# Patient Record
Sex: Female | Born: 1985 | Race: White | Marital: Single
Health system: Northeastern US, Community
[De-identification: ages and names within clinical notes are randomized; demographics above are authoritative.]

## PROBLEM LIST (undated history)

## (undated) DIAGNOSIS — F419 Anxiety disorder, unspecified: Secondary | ICD-10-CM

## (undated) DIAGNOSIS — F329 Major depressive disorder, single episode, unspecified: Secondary | ICD-10-CM

## (undated) DIAGNOSIS — K219 Gastro-esophageal reflux disease without esophagitis: Secondary | ICD-10-CM

## (undated) DIAGNOSIS — J45909 Unspecified asthma, uncomplicated: Secondary | ICD-10-CM

## (undated) DIAGNOSIS — F32A Depression, unspecified: Secondary | ICD-10-CM

## (undated) HISTORY — PX: WISDOM TOOTH EXTRACTION: SHX21

## (undated) HISTORY — PX: UPPER GI ENDOSCOPY: SHX6162

---
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
## 2007-06-18 DIAGNOSIS — J45909 Unspecified asthma, uncomplicated: Secondary | ICD-10-CM | POA: Insufficient documentation

## 2007-06-18 DIAGNOSIS — F3289 Other specified depressive episodes: Secondary | ICD-10-CM | POA: Insufficient documentation

## 2007-06-18 DIAGNOSIS — R569 Unspecified convulsions: Secondary | ICD-10-CM | POA: Insufficient documentation

## 2007-06-18 DIAGNOSIS — F329 Major depressive disorder, single episode, unspecified: Secondary | ICD-10-CM | POA: Insufficient documentation

## 2009-01-07 ENCOUNTER — Emergency Department (HOSPITAL_COMMUNITY): Admission: EM | Admit: 2009-01-07 | Discharge: 2009-01-07 | Payer: Self-pay | Admitting: Emergency Medicine

## 2009-12-18 ENCOUNTER — Emergency Department (HOSPITAL_COMMUNITY): Admission: EM | Admit: 2009-12-18 | Discharge: 2009-12-18 | Payer: Self-pay | Admitting: Family Medicine

## 2010-06-15 ENCOUNTER — Other Ambulatory Visit: Admission: RE | Admit: 2010-06-15 | Discharge: 2010-06-15 | Payer: Self-pay | Admitting: Family Medicine

## 2010-06-22 ENCOUNTER — Encounter: Admission: RE | Admit: 2010-06-22 | Discharge: 2010-06-22 | Payer: Self-pay | Admitting: Family Medicine

## 2011-02-15 LAB — POCT I-STAT, CHEM 8
Chloride: 106 mEq/L (ref 96–112)
Creatinine, Ser: 0.7 mg/dL (ref 0.4–1.2)
Glucose, Bld: 99 mg/dL (ref 70–99)
HCT: 44 % (ref 36.0–46.0)
Sodium: 140 mEq/L (ref 135–145)
TCO2: 23 mmol/L (ref 0–100)

## 2011-02-15 LAB — POCT URINALYSIS DIP (DEVICE)
Bilirubin Urine: NEGATIVE
Glucose, UA: NEGATIVE mg/dL
Hgb urine dipstick: NEGATIVE
Ketones, ur: 40 mg/dL — AB
Nitrite: NEGATIVE
Protein, ur: 30 mg/dL — AB
Specific Gravity, Urine: 1.02 (ref 1.005–1.030)
pH: 5.5 (ref 5.0–8.0)

## 2011-03-04 ENCOUNTER — Emergency Department (HOSPITAL_COMMUNITY): Payer: BC Managed Care – PPO

## 2011-03-04 ENCOUNTER — Emergency Department (HOSPITAL_COMMUNITY)
Admission: EM | Admit: 2011-03-04 | Discharge: 2011-03-04 | Disposition: A | Discharge status: Home / Self Care | Payer: BC Managed Care – PPO | Attending: Emergency Medicine | Admitting: Emergency Medicine

## 2011-03-04 DIAGNOSIS — R0609 Other forms of dyspnea: Secondary | ICD-10-CM | POA: Insufficient documentation

## 2011-03-04 DIAGNOSIS — R05 Cough: Secondary | ICD-10-CM | POA: Insufficient documentation

## 2011-03-04 DIAGNOSIS — J45909 Unspecified asthma, uncomplicated: Secondary | ICD-10-CM | POA: Insufficient documentation

## 2011-03-04 DIAGNOSIS — R059 Cough, unspecified: Secondary | ICD-10-CM | POA: Insufficient documentation

## 2011-03-04 DIAGNOSIS — R0989 Other specified symptoms and signs involving the circulatory and respiratory systems: Secondary | ICD-10-CM | POA: Insufficient documentation

## 2013-01-12 ENCOUNTER — Emergency Department (HOSPITAL_COMMUNITY)
Admission: EM | Admit: 2013-01-12 | Discharge: 2013-01-12 | Disposition: A | Discharge status: Home / Self Care | Payer: BC Managed Care – PPO | Attending: Emergency Medicine | Admitting: Emergency Medicine

## 2013-01-12 ENCOUNTER — Encounter (HOSPITAL_COMMUNITY): Payer: Self-pay | Admitting: Emergency Medicine

## 2013-01-12 DIAGNOSIS — R631 Polydipsia: Secondary | ICD-10-CM | POA: Insufficient documentation

## 2013-01-12 DIAGNOSIS — K529 Noninfective gastroenteritis and colitis, unspecified: Secondary | ICD-10-CM

## 2013-01-12 DIAGNOSIS — R000 Tachycardia, unspecified: Secondary | ICD-10-CM | POA: Insufficient documentation

## 2013-01-12 DIAGNOSIS — E669 Obesity, unspecified: Secondary | ICD-10-CM | POA: Insufficient documentation

## 2013-01-12 DIAGNOSIS — R1084 Generalized abdominal pain: Secondary | ICD-10-CM | POA: Insufficient documentation

## 2013-01-12 DIAGNOSIS — R197 Diarrhea, unspecified: Secondary | ICD-10-CM | POA: Insufficient documentation

## 2013-01-12 DIAGNOSIS — Z3202 Encounter for pregnancy test, result negative: Secondary | ICD-10-CM | POA: Insufficient documentation

## 2013-01-12 DIAGNOSIS — K5289 Other specified noninfective gastroenteritis and colitis: Secondary | ICD-10-CM | POA: Insufficient documentation

## 2013-01-12 DIAGNOSIS — J45909 Unspecified asthma, uncomplicated: Secondary | ICD-10-CM | POA: Insufficient documentation

## 2013-01-12 DIAGNOSIS — F329 Major depressive disorder, single episode, unspecified: Secondary | ICD-10-CM | POA: Insufficient documentation

## 2013-01-12 DIAGNOSIS — F3289 Other specified depressive episodes: Secondary | ICD-10-CM | POA: Insufficient documentation

## 2013-01-12 DIAGNOSIS — R5381 Other malaise: Secondary | ICD-10-CM | POA: Insufficient documentation

## 2013-01-12 DIAGNOSIS — R5383 Other fatigue: Secondary | ICD-10-CM | POA: Insufficient documentation

## 2013-01-12 HISTORY — DX: Unspecified asthma, uncomplicated: J45.909

## 2013-01-12 HISTORY — DX: Major depressive disorder, single episode, unspecified: F32.9

## 2013-01-12 HISTORY — DX: Depression, unspecified: F32.A

## 2013-01-12 LAB — URINALYSIS, ROUTINE W REFLEX MICROSCOPIC
Glucose, UA: NEGATIVE mg/dL
Nitrite: NEGATIVE
Protein, ur: NEGATIVE mg/dL
Specific Gravity, Urine: 1.023 (ref 1.005–1.030)

## 2013-01-12 LAB — POCT I-STAT, CHEM 8
BUN: 14 mg/dL (ref 6–23)
Chloride: 105 mEq/L (ref 96–112)
Glucose, Bld: 116 mg/dL — ABNORMAL HIGH (ref 70–99)
HCT: 37 % (ref 36.0–46.0)
Hemoglobin: 12.6 g/dL (ref 12.0–15.0)
Sodium: 141 mEq/L (ref 135–145)

## 2013-01-12 LAB — PREGNANCY, URINE: Preg Test, Ur: NEGATIVE

## 2013-01-12 MED ORDER — SODIUM CHLORIDE 0.9 % IV BOLUS (SEPSIS)
2000.0000 mL | Freq: Once | INTRAVENOUS | Status: AC
  Administered 2013-01-12: 2000 mL via INTRAVENOUS

## 2013-01-12 MED ORDER — ONDANSETRON 4 MG PO TBDP: ORAL_TABLET | ORAL | Status: DC

## 2013-01-12 MED ORDER — ACETAMINOPHEN 325 MG PO TABS
650.0000 mg | ORAL_TABLET | Freq: Once | ORAL | Status: AC
  Administered 2013-01-12: 650 mg via ORAL
  Filled 2013-01-12: qty 2

## 2013-01-12 MED ORDER — ONDANSETRON HCL 4 MG/2ML IJ SOLN
4.0000 mg | Freq: Once | INTRAMUSCULAR | Status: AC
  Administered 2013-01-12: 4 mg via INTRAVENOUS
  Filled 2013-01-12: qty 2

## 2013-01-12 MED ORDER — LOPERAMIDE HCL 2 MG PO CAPS
4.0000 mg | ORAL_CAPSULE | Freq: Once | ORAL | Status: AC
  Administered 2013-01-12: 4 mg via ORAL
  Filled 2013-01-12: qty 2

## 2013-01-12 NOTE — ED Notes (Signed)
Per Pt- Pt started having nausea and vomiting around 2200 tonight. Pt states she has felt fatigue for the last few days. Alertx4, NAD

## 2013-01-12 NOTE — ED Notes (Signed)
Notified lab to regular preg test down stairs

## 2013-01-12 NOTE — ED Provider Notes (Signed)
History     CSN: [id number]  Arrival date & time 01/12/13  5638   First MD Initiated Contact with Patient 01/12/13 0505      Chief Complaint  Patient presents with  . Emesis    (Consider location/radiation/quality/duration/timing/severity/associated sxs/prior treatment) HPI Patient developed nausea vomiting and watery diarrhea onset 8 PM yesterday. Vomiting approximately 8 times at first food and and yellow material. Has had approximately 10 episodes of watery diarrhea. She also admits to vague diffuse abdominal discomfort. No treatment prior to coming here. Nothing makes symptoms better or worse. Abdominal discomfort is nonradiating. Mild at present. Other associated symptoms include generalized weakness, and thirst. Past Medical History  Diagnosis Date  . Asthma   . Depression     History reviewed. No pertinent past surgical history. Surgical history negative No family history on file.  History  Substance Use Topics  . Smoking status: Not on file  . Smokeless tobacco: Not on file  . Alcohol Use: Not on file   No tobacco. Occasional alcohol no drug use OB History   Grav Para Term Preterm Abortions TAB SAB Ect Mult Living                  Review of Systems  HENT: Negative.   Respiratory: Negative.   Cardiovascular: Negative.   Gastrointestinal: Positive for nausea, vomiting, abdominal pain and diarrhea.  Musculoskeletal: Negative.   Skin: Negative.   Neurological: Positive for weakness.  Psychiatric/Behavioral: Negative.   All other systems reviewed and are negative.    Allergies  Penicillins  Home Medications  No current outpatient prescriptions on file. Medications Lexapro, birth control pills, vitamin D, albuterol BP 124/75  Temp(Src) 99.9 F (37.7 C) (Oral)  Resp 15  SpO2 98%  Physical Exam  Nursing note and vitals reviewed. Constitutional: She appears well-developed and well-nourished. No distress.  HENT:  Head: Normocephalic and atraumatic.   Mucous membranes dry  Eyes: Conjunctivae are normal. Pupils are equal, round, and reactive to light.  Neck: Neck supple. No tracheal deviation present. No thyromegaly present.  Cardiovascular: Regular rhythm.   No murmur heard. Tachycardic  Pulmonary/Chest: Effort normal and breath sounds normal.  Abdominal: Soft. Bowel sounds are normal. She exhibits no distension. There is no tenderness.  Obese  Musculoskeletal: Normal range of motion. She exhibits no edema and no tenderness.  Neurological: She is alert. No cranial nerve deficit. Coordination normal.  Skin: Skin is warm and dry. No rash noted.  Psychiatric: She has a normal mood and affect.    ED Course  Procedures (including critical care time)  Labs Reviewed  URINALYSIS, ROUTINE W REFLEX MICROSCOPIC   No results found.  Date: 01/12/2013  Rate: 120  Rhythm: sinus tachycardia  QRS Axis: normal  Intervals: normal  ST/T Wave abnormalities: normal  Conduction Disutrbances:none  Narrative Interpretation:   Old EKG Reviewed: none available Results for orders placed during the hospital encounter of 01/12/13  POCT I-STAT, CHEM 8      Result Value Range   Sodium 141  135 - 145 mEq/L   Potassium 3.7  3.5 - 5.1 mEq/L   Chloride 105  96 - 112 mEq/L   BUN 14  6 - 23 mg/dL   Creatinine, Ser 7.56  0.50 - 1.10 mg/dL   Glucose, Bld 433 (*) 70 - 99 mg/dL   Calcium, Ion 2.95 (*) 1.12 - 1.23 mmol/L   TCO2 28  0 - 100 mmol/L   Hemoglobin 12.6  12.0 - 15.0 g/dL  HCT 37.0  36.0 - 46.0 %   No results found.   No diagnosis found.  7: 15 a.m. patient feels improved after treatment with intravenous fluids, Zofran and Imodium. She's resting comfortably. Signed out to Dr Ranae Palms pending lab results  MDM  Patient mildly dehydrated on initial presentation.Dr. Ranae Palms to reassess and disposition Diagnosis #1 gastroenteritis #2 dehydration        Doug Sou, MD 01/12/13 [phone]

## 2013-04-07 ENCOUNTER — Emergency Department (HOSPITAL_COMMUNITY)
Admission: EM | Admit: 2013-04-07 | Discharge: 2013-04-07 | Disposition: A | Discharge status: Home / Self Care | Payer: BC Managed Care – PPO | Attending: Emergency Medicine | Admitting: Emergency Medicine

## 2013-04-07 ENCOUNTER — Emergency Department (HOSPITAL_COMMUNITY): Payer: BC Managed Care – PPO

## 2013-04-07 ENCOUNTER — Encounter (HOSPITAL_COMMUNITY): Payer: Self-pay | Admitting: *Deleted

## 2013-04-07 DIAGNOSIS — F329 Major depressive disorder, single episode, unspecified: Secondary | ICD-10-CM | POA: Insufficient documentation

## 2013-04-07 DIAGNOSIS — J45909 Unspecified asthma, uncomplicated: Secondary | ICD-10-CM | POA: Insufficient documentation

## 2013-04-07 DIAGNOSIS — R112 Nausea with vomiting, unspecified: Secondary | ICD-10-CM | POA: Insufficient documentation

## 2013-04-07 DIAGNOSIS — Z79899 Other long term (current) drug therapy: Secondary | ICD-10-CM | POA: Insufficient documentation

## 2013-04-07 DIAGNOSIS — S8990XA Unspecified injury of unspecified lower leg, initial encounter: Secondary | ICD-10-CM | POA: Insufficient documentation

## 2013-04-07 DIAGNOSIS — F3289 Other specified depressive episodes: Secondary | ICD-10-CM | POA: Insufficient documentation

## 2013-04-07 DIAGNOSIS — S99911A Unspecified injury of right ankle, initial encounter: Secondary | ICD-10-CM

## 2013-04-07 DIAGNOSIS — S99912A Unspecified injury of left ankle, initial encounter: Secondary | ICD-10-CM

## 2013-04-07 DIAGNOSIS — F10120 Alcohol abuse with intoxication, uncomplicated: Secondary | ICD-10-CM

## 2013-04-07 DIAGNOSIS — S0990XA Unspecified injury of head, initial encounter: Secondary | ICD-10-CM | POA: Insufficient documentation

## 2013-04-07 DIAGNOSIS — Z880 Allergy status to penicillin: Secondary | ICD-10-CM | POA: Insufficient documentation

## 2013-04-07 DIAGNOSIS — Y929 Unspecified place or not applicable: Secondary | ICD-10-CM | POA: Insufficient documentation

## 2013-04-07 DIAGNOSIS — X500XXA Overexertion from strenuous movement or load, initial encounter: Secondary | ICD-10-CM | POA: Insufficient documentation

## 2013-04-07 DIAGNOSIS — S99929A Unspecified injury of unspecified foot, initial encounter: Secondary | ICD-10-CM | POA: Insufficient documentation

## 2013-04-07 DIAGNOSIS — Y9389 Activity, other specified: Secondary | ICD-10-CM | POA: Insufficient documentation

## 2013-04-07 LAB — POCT I-STAT, CHEM 8
Calcium, Ion: 1.2 mmol/L (ref 1.12–1.23)
Glucose, Bld: 78 mg/dL (ref 70–99)
Hemoglobin: 14.6 g/dL (ref 12.0–15.0)
Potassium: 3.9 mEq/L (ref 3.5–5.1)
TCO2: 28 mmol/L (ref 0–100)

## 2013-04-07 MED ORDER — ONDANSETRON HCL 8 MG PO TABS: 4.0000 mg | ORAL_TABLET | Freq: Once | ORAL | Status: AC

## 2013-04-07 MED ORDER — ONDANSETRON 4 MG PO TBDP
ORAL_TABLET | ORAL | Status: AC
  Administered 2013-04-07: 4 mg via ORAL
  Filled 2013-04-07: qty 1

## 2013-04-07 MED ORDER — LORAZEPAM 2 MG/ML IJ SOLN
1.0000 mg | Freq: Once | INTRAMUSCULAR | Status: AC
  Administered 2013-04-07: 1 mg via INTRAMUSCULAR
  Filled 2013-04-07: qty 1

## 2013-04-07 MED ORDER — ONDANSETRON 4 MG PO TBDP: ORAL_TABLET | ORAL | Status: DC

## 2013-04-07 MED ORDER — ONDANSETRON 4 MG PO TBDP
ORAL_TABLET | ORAL | Status: AC
  Filled 2013-04-07: qty 1

## 2013-04-07 MED ORDER — ONDANSETRON 4 MG PO TBDP
4.0000 mg | ORAL_TABLET | Freq: Once | ORAL | Status: AC
  Administered 2013-04-07 (×2): 4 mg via ORAL

## 2013-04-07 MED ORDER — IBUPROFEN 400 MG PO TABS
600.0000 mg | ORAL_TABLET | Freq: Once | ORAL | Status: AC
  Administered 2013-04-07: 600 mg via ORAL
  Filled 2013-04-07: qty 1

## 2013-04-07 NOTE — ED Notes (Signed)
Pt states drinking etoh last nite.  Pt states that she fell around 3 am and now has pain to both ankles.  Then started vomiting around 0900 and developed a headache related to etoh.  Pt never hit head when she fell

## 2013-04-07 NOTE — ED Notes (Signed)
Pt continues to tolerate fluids.

## 2013-04-07 NOTE — ED Provider Notes (Signed)
History  This chart was scribed for non-physician practitioner Arthor Captain, PA-C, working with Flint Melter, MD, by Yevette Edwards, ED Scribe. This patient was seen in room TR09C/TR09C and the patient's care was started at 5:40 PM   CSN: [id number]  Arrival date & time 04/07/13  1326   First MD Initiated Contact with Patient 04/07/13 1652      Chief Complaint  Patient presents with  . Emesis  . Ankle Injury  . Headache    The history is provided by the patient. No language interpreter was used.   HPI Comments: Leslie Thompson is a 27 y.o. female who presents to the Emergency Department complaining of constant, diffuse bilateral pain to the ankles which occurred after rolling her right ankle and then falling last night. She denies hitting her head or LOC following the fall. She denies numbness.  The pt states that she has a headache and has experienced nausea and emesis due to drinking tequila last night.    Past Medical History  Diagnosis Date  . Asthma   . Depression     History reviewed. No pertinent past surgical history.  No family history on file.  History  Substance Use Topics  . Smoking status: Never Smoker   . Smokeless tobacco: Not on file  . Alcohol Use: Yes    No ob history provided.   Review of Systems  Eyes: Negative for visual disturbance.  Gastrointestinal: Positive for nausea and vomiting.  Musculoskeletal: Positive for arthralgias.  Neurological: Positive for headaches. Negative for syncope and numbness.  All other systems reviewed and are negative.   Allergies  Penicillins  Home Medications   Current Outpatient Rx  Name  Route  Sig  Dispense  Refill  . albuterol (PROVENTIL HFA;VENTOLIN HFA) 108 (90 BASE) MCG/ACT inhaler   Inhalation   Inhale 2 puffs into the lungs every 6 (six) hours as needed for wheezing.         . budesonide (PULMICORT) 180 MCG/ACT inhaler   Inhalation   Inhale 1 puff into the lungs 2 (two) times daily.          Marland Kitchen escitalopram (LEXAPRO) 10 MG tablet   Oral   Take 20 mg by mouth daily.         Marland Kitchen PRESCRIPTION MEDICATION   Oral   Take 1 tablet by mouth daily. Birth control tablet         . Vitamin D, Ergocalciferol, (DRISDOL) 50000 UNITS CAPS   Oral   Take 50,000 Units by mouth every 7 (seven) days. Take on thursday           Triage Vitals: BP 130/73  Pulse 77  Temp(Src) 98.5 F (36.9 C) (Oral)  Resp 18  SpO2 97%  LMP 03/02/2013  Physical Exam  Nursing note and vitals reviewed. Constitutional: She is oriented to person, place, and time. She appears well-developed and well-nourished. No distress.  HENT:  Head: Normocephalic and atraumatic.  Multiple petechiae surrounding eyes, forehead  Pulmonary/Chest: Effort normal. No respiratory distress.  Musculoskeletal: She exhibits tenderness.  A bilateral ankle exam was performed. Skin: bruising Swelling: mild Warmth: no warmth Tenderness: diffuse and maximal at left posterior to lateral malleolus due to large hematoma, Right ankle pain over proximal 1st metatarsal and bruising present ROM: limited by pain Gait: antalgic Neurological Exam: normal Vascular Exam: normal  Abrasion to the right anterior shin and left lateral anterior shin.      Neurological: She is alert and oriented to person,  place, and time.  Skin: Skin is warm. She is not diaphoretic.  Petechia around eyes bilaterally and mouth.   Psychiatric: She has a normal mood and affect. Her behavior is normal.    ED Course  Procedures (including critical care time)  DIAGNOSTIC STUDIES: Oxygen Saturation is 97% on room air, normal by my interpretation.    COORDINATION OF CARE:  5:46 PM- Discussed treatment plan with pt which includes discussing the results of her x-rays, anti-nausea medicine, and pain medicine. Informed the pt that she should utilize a walking boot for her right foot. Pt agreed to plan.   7:20 PM-Rechecked pt: pt's symptoms have improved.      Labs Reviewed  POCT I-STAT, CHEM 8 - Abnormal; Notable for the following:    Creatinine, Ser 0.40 (*)    All other components within normal limits   Dg Ankle Complete Left  04/07/2013   *RADIOLOGY REPORT*  Clinical Data: Ankle injury, pain  LEFT ANKLE COMPLETE - 3+ VIEW  Comparison: None.  Findings: There is no evidence of fracture or dislocation.  There is no evidence of arthropathy or other focal bony abnormality. Moderate lateral soft tissue swelling.  Ankle mortise intact.  IMPRESSION: Lateral soft tissue swelling.  No visible fracture or dislocation.   Original Report Authenticated By: Davonna Belling, M.D.   Dg Ankle Complete Right  04/07/2013   *RADIOLOGY REPORT*  Clinical Data: Ankle pain.  Ankle swelling and bruising.  RIGHT ANKLE - COMPLETE 3+ VIEW  Comparison: None.  Findings: Ankle mortise is congruent.  Anatomic alignment of the ankle.  There is no fracture.  The talar dome appears intact.  Tiny calcaneal spur incidentally noted.  Benign bone island in the anterior calcaneus.  IMPRESSION: Negative.   Original Report Authenticated By: Andreas Newport, M.D.     1. Ankle injury, left, initial encounter   2. Right ankle injury, initial encounter   3. Hangover effect, uncomplicated       MDM  Negative xrays. Patient with side effects of hangover from night of tequila shots with girlfriends. No abdominal pain. Petechia form vomiting.The patientsN?V resolved after meds. Tolerating PO fluids. Patien has Cam walker/crutches at home. ACE bandages applied.The patient appears reasonably screened and/or stabilized for discharge and I doubt any other medical condition or other [facility] requiring further screening, evaluation, or treatment in the ED at this time prior to discharge.    I personally performed the services described in this documentation, which was scribed in my presence. The recorded information has been reviewed and is accurate.        Arthor Captain, PA-C 04/08/13 1043

## 2013-04-07 NOTE — Progress Notes (Signed)
Orthopedic Tech Progress Note Patient Details:  Leslie Thompson Mar 10, 1986 [id number]  Ortho Devices Type of Ortho Device: Ace wrap Ortho Device/Splint Location: (B) LE Ortho Device/Splint Interventions: Ordered;Application   Jennye Moccasin 04/07/2013, 6:10 PM

## 2013-04-07 NOTE — ED Notes (Signed)
Pt given gingerale able to keep it down.

## 2013-04-08 NOTE — ED Provider Notes (Signed)
Medical screening examination/treatment/procedure(s) were performed by non-physician practitioner and as supervising physician I was immediately available for consultation/collaboration.  Flint Melter, MD 04/08/13 1745

## 2013-11-05 ENCOUNTER — Encounter (HOSPITAL_COMMUNITY): Payer: Self-pay | Admitting: Emergency Medicine

## 2013-11-05 DIAGNOSIS — IMO0001 Reserved for inherently not codable concepts without codable children: Secondary | ICD-10-CM | POA: Insufficient documentation

## 2013-11-05 DIAGNOSIS — F1010 Alcohol abuse, uncomplicated: Secondary | ICD-10-CM | POA: Insufficient documentation

## 2013-11-05 DIAGNOSIS — E860 Dehydration: Secondary | ICD-10-CM | POA: Insufficient documentation

## 2013-11-05 DIAGNOSIS — F3289 Other specified depressive episodes: Secondary | ICD-10-CM | POA: Insufficient documentation

## 2013-11-05 DIAGNOSIS — Z79899 Other long term (current) drug therapy: Secondary | ICD-10-CM | POA: Insufficient documentation

## 2013-11-05 DIAGNOSIS — J45909 Unspecified asthma, uncomplicated: Secondary | ICD-10-CM | POA: Insufficient documentation

## 2013-11-05 DIAGNOSIS — R197 Diarrhea, unspecified: Secondary | ICD-10-CM | POA: Insufficient documentation

## 2013-11-05 DIAGNOSIS — Z3202 Encounter for pregnancy test, result negative: Secondary | ICD-10-CM | POA: Insufficient documentation

## 2013-11-05 DIAGNOSIS — Z880 Allergy status to penicillin: Secondary | ICD-10-CM | POA: Insufficient documentation

## 2013-11-05 DIAGNOSIS — F329 Major depressive disorder, single episode, unspecified: Secondary | ICD-10-CM | POA: Insufficient documentation

## 2013-11-05 DIAGNOSIS — R1110 Vomiting, unspecified: Secondary | ICD-10-CM | POA: Insufficient documentation

## 2013-11-05 MED ORDER — ONDANSETRON 4 MG PO TBDP
8.0000 mg | ORAL_TABLET | Freq: Once | ORAL | Status: AC
  Administered 2013-11-05: 8 mg via ORAL
  Filled 2013-11-05: qty 2

## 2013-11-05 NOTE — ED Notes (Signed)
The pt  Drank too much alcohol last pm and all day today she has been vomiting and she has been unable to keep anyhting down.  lmp  2 weeks ago   Diarrhea this am none since

## 2013-11-06 ENCOUNTER — Emergency Department (HOSPITAL_COMMUNITY)
Admission: EM | Admit: 2013-11-06 | Discharge: 2013-11-06 | Disposition: A | Discharge status: Home / Self Care | Payer: BC Managed Care – PPO | Attending: Emergency Medicine | Admitting: Emergency Medicine

## 2013-11-06 DIAGNOSIS — E860 Dehydration: Secondary | ICD-10-CM

## 2013-11-06 DIAGNOSIS — R1110 Vomiting, unspecified: Secondary | ICD-10-CM

## 2013-11-06 LAB — BASIC METABOLIC PANEL
BUN: 9 mg/dL (ref 6–23)
CALCIUM: 9.6 mg/dL (ref 8.4–10.5)
CHLORIDE: 100 meq/L (ref 96–112)
CO2: 27 mEq/L (ref 19–32)
CREATININE: 0.58 mg/dL (ref 0.50–1.10)
GFR calc non Af Amer: >90 mL/min (ref >90)
Glucose, Bld: 96 mg/dL (ref 70–99)
Potassium: 3.6 mEq/L — ABNORMAL LOW (ref 3.7–5.3)
SODIUM: 140 meq/L (ref 137–147)

## 2013-11-06 LAB — URINALYSIS, ROUTINE W REFLEX MICROSCOPIC
Glucose, UA: NEGATIVE mg/dL
Hgb urine dipstick: NEGATIVE
KETONES UR: 15 mg/dL — AB
LEUKOCYTES UA: NEGATIVE
NITRITE: NEGATIVE
PROTEIN: NEGATIVE mg/dL
Specific Gravity, Urine: 1.026 (ref 1.005–1.030)
UROBILINOGEN UA: 0.2 mg/dL (ref 0.0–1.0)
pH: 6 (ref 5.0–8.0)

## 2013-11-06 LAB — POCT PREGNANCY, URINE: Preg Test, Ur: NEGATIVE

## 2013-11-06 MED ORDER — ONDANSETRON HCL 8 MG PO TABS: 8.0000 mg | ORAL_TABLET | Freq: Three times a day (TID) | ORAL | Status: DC | PRN

## 2013-11-06 MED ORDER — ONDANSETRON 4 MG PO TBDP
4.0000 mg | ORAL_TABLET | Freq: Once | ORAL | Status: AC
  Administered 2013-11-06: 4 mg via ORAL
  Filled 2013-11-06: qty 1

## 2013-11-06 NOTE — Discharge Instructions (Signed)
Dehydration, Adult  Dehydration means your body does not have as much fluid as it needs. Your kidneys, brain, and heart will not work properly without the right amount of fluids and salt.   HOME CARE   Ask your doctor how to replace body fluid losses (rehydrate).   Drink enough fluids to keep your pee (urine) clear or pale yellow.   Drink small amounts of fluids often if you feel sick to your stomach (nauseous) or throw up (vomit).   Eat like you normally do.   Avoid:   Foods or drinks high in sugar.   Bubbly (carbonated) drinks.   Juice.   Very hot or cold fluids.   Drinks with caffeine.   Fatty, greasy foods.   Alcohol.   Tobacco.   Eating too much.   Gelatin desserts.   Wash your hands to avoid spreading germs (bacteria, viruses).   Only take medicine as told by your doctor.   Keep all doctor visits as told.  GET HELP RIGHT AWAY IF:    You cannot drink something without throwing up.   You get worse even with treatment.   Your vomit has blood in it or looks greenish.   Your poop (stool) has blood in it or looks black and tarry.   You have not peed in 6 to 8 hours.   You pee a small amount of very dark pee.   You have a fever.   You pass out (faint).   You have belly (abdominal) pain that gets worse or stays in one spot (localizes).   You have a rash, stiff neck, or bad headache.   You get easily annoyed, sleepy, or are hard to wake up.   You feel weak, dizzy, or very thirsty.  MAKE SURE YOU:    Understand these instructions.   Will watch your condition.   Will get help right away if you are not doing well or get worse.  Document Released: 08/18/2009 Document Revised: 01/14/2012 Document Reviewed: 06/11/2011  Exit[employer] Patient Information 2014 [employer].

## 2013-11-06 NOTE — ED Provider Notes (Signed)
CSN: [id number]     Arrival date & time 11/05/13  2332 History   First MD Initiated Contact with Patient 11/06/13 [phone]     Chief Complaint  Patient presents with  . Emesis    Patient is a 28 y.o. female presenting with vomiting. The history is provided by the patient.  Emesis Severity:  Moderate Timing:  Intermittent Progression:  Worsening Chronicity:  New Relieved by:  Nothing Worsened by:  Liquids Associated symptoms: diarrhea and myalgias   Associated symptoms: no abdominal pain and no fever     Past Medical History  Diagnosis Date  . Asthma   . Depression    History reviewed. No pertinent past surgical history. No family history on file. History  Substance Use Topics  . Smoking status: Never Smoker   . Smokeless tobacco: Not on file  . Alcohol Use: Yes   OB History   Grav Para Term Preterm Abortions TAB SAB Ect Mult Living                 Review of Systems  Gastrointestinal: Positive for vomiting and diarrhea. Negative for abdominal pain.  Musculoskeletal: Positive for myalgias.  All other systems reviewed and are negative.    Allergies  Penicillins  Home Medications   Current Outpatient Rx  Name  Route  Sig  Dispense  Refill  . albuterol (PROVENTIL HFA;VENTOLIN HFA) 108 (90 BASE) MCG/ACT inhaler   Inhalation   Inhale 2 puffs into the lungs every 6 (six) hours as needed for wheezing.         Marland Kitchen. escitalopram (LEXAPRO) 10 MG tablet   Oral   Take 20 mg by mouth daily.          BP 117/53  Pulse 78  Temp(Src) 99.4 F (37.4 C) (Oral)  Resp 18  Wt 235 lb (106.595 kg)  SpO2 97%  LMP 10/22/2013 Physical Exam CONSTITUTIONAL: Well developed/well nourished HEAD: Normocephalic/atraumatic EYES: EOMI/PERRL, no icterus ENMT: Mucous membranes dry NECK: supple no meningeal signs SPINE:entire spine nontender CV: S1/S2 noted, no murmurs/rubs/gallops noted LUNGS: Lungs are clear to auscultation bilaterally, no apparent distress ABDOMEN: soft, nontender,  no rebound or guarding GU:no cva tenderness NEURO: Pt is awake/alert, moves all extremitiesx4 EXTREMITIES: pulses normal, full ROM SKIN: warm, color normal PSYCH: no abnormalities of mood noted  ED Course  Procedures (including critical care time) Labs Review Labs Reviewed  BASIC METABOLIC PANEL - Abnormal; Notable for the following:    Potassium 3.6 (*)    All other components within normal limits  URINALYSIS, ROUTINE W REFLEX MICROSCOPIC - Abnormal; Notable for the following:    Color, Urine AMBER (*)    APPearance CLOUDY (*)    Bilirubin Urine SMALL (*)    Ketones, ur 15 (*)    All other components within normal limits  POCT PREGNANCY, URINE   Imaging Review No results found.  EKG Interpretation   None       MDM  No diagnosis found. Nursing notes including past medical history and social history reviewed and considered in documentation Labs/vital reviewed and considered  4:48 AM Pt with vomiting after heavy ETOH use on new years eve Denies any other drug use She has no abd pain or focal tenderness Her labs are reassuring Stable for d/c home    Joya Gaskinsonald W Jarryd Gratz, MD 11/06/13 0448

## 2013-11-06 NOTE — ED Notes (Signed)
Pt attempting to void.  

## 2013-11-06 NOTE — ED Notes (Signed)
MD at BS

## 2017-02-12 LAB — CHLAMYDIA/GC (EXT)
Chlamydia trachomatis (EXT): NEGATIVE
Neisseria gonorrhoeae (EXT): NEGATIVE

## 2018-03-25 ENCOUNTER — Ambulatory Visit (INDEPENDENT_AMBULATORY_CARE_PROVIDER_SITE_OTHER): Payer: 59

## 2018-03-25 ENCOUNTER — Ambulatory Visit (INDEPENDENT_AMBULATORY_CARE_PROVIDER_SITE_OTHER): Payer: 59 | Admitting: Orthopedic Surgery

## 2018-03-25 DIAGNOSIS — M25561 Pain in right knee: Secondary | ICD-10-CM | POA: Diagnosis not present

## 2018-03-29 ENCOUNTER — Encounter (INDEPENDENT_AMBULATORY_CARE_PROVIDER_SITE_OTHER): Payer: Self-pay | Admitting: Orthopedic Surgery

## 2018-03-29 DIAGNOSIS — M25561 Pain in right knee: Secondary | ICD-10-CM | POA: Diagnosis not present

## 2018-03-29 MED ORDER — METHYLPREDNISOLONE ACETATE 40 MG/ML IJ SUSP
40.0000 mg | INTRAMUSCULAR | Status: AC | PRN
  Administered 2018-03-29: 40 mg via INTRA_ARTICULAR

## 2018-03-29 MED ORDER — LIDOCAINE HCL 1 % IJ SOLN
5.0000 mL | INTRAMUSCULAR | Status: AC | PRN
  Administered 2018-03-29: 5 mL

## 2018-03-29 MED ORDER — BUPIVACAINE HCL 0.25 % IJ SOLN
4.0000 mL | INTRAMUSCULAR | Status: AC | PRN
  Administered 2018-03-29: 4 mL via INTRA_ARTICULAR

## 2018-03-29 NOTE — Progress Notes (Signed)
Office Visit Note   Patient: Leslie Thompson           Date of Birth: 1986-03-18           MRN: [id number] Visit Date: 03/25/2018 Requested by: Laurann Montana, MD [phone] [place], [place] [address] PCP: Laurann Montana, MD  Subjective: Chief Complaint  Patient presents with  . Right Knee - Pain    HPI: Georgian is a 32 year old patient with right knee pain.  Her knee is been hurting for 3 weeks after getting herself out of the pool and sustaining an impact injury              ROS: The patellar region.  She had to walk 4 miles after that injury and she states "has not felt right since".  Abi feels like there is something inside the knee in the retropatellar region.  She has not really been able to swim walk or exercise since the injury.  She works as a Consulting civil engineer.  She has been taking Aleve as needed.  Assessment & Plan: Visit Diagnoses:  1. Acute pain of right knee     Plan: Impression is right knee pain possible irritation of fat pad in the retropatellar region versus structural/mechanical damage to the meniscus or chondral cartilage.  Plan is injection into the knee today with decision for or against MRI scanning in 3 weeks.  She really needs to be able to walk effectively as a student because she has long travel.  She is an [place] major.  She is got a call in 3 weeks and we can set up MRI scanning at that time if needed.  She is going to spend 3 or 4 days at her grandmother's house currently.  Follow-Up Instructions: No follow-ups on file.   Orders:  Orders Placed This Encounter  Procedures  . XR Knee 1-2 Views Right   No orders of the defined types were placed in this encounter.     Procedures: Large Joint Inj: R knee on 03/29/2018 10:05 AM Indications: diagnostic evaluation, joint swelling and pain Details: 18 G 1.5 in needle, superolateral approach  Arthrogram: No  Medications: 5 mL lidocaine 1 %; 40 mg methylPREDNISolone acetate 40 MG/ML; 4 mL  bupivacaine 0.25 % Outcome: tolerated well, no immediate complications Procedure, treatment alternatives, risks and benefits explained, specific risks discussed. Consent was given by the patient. Immediately prior to procedure a time out was called to verify the correct patient, procedure, equipment, support staff and site/side marked as required. Patient was prepped and draped in the usual sterile fashion.       Clinical Data: No additional findings.  Objective: Vital Signs: There were no vitals taken for this visit.  Physical Exam:   Constitutional: Patient appears well-developed HEENT:  Head: Normocephalic Eyes:EOM are normal Neck: Normal range of motion Cardiovascular: Normal rate Pulmonary/chest: Effort normal Neurologic: Patient is alert Skin: Skin is warm Psychiatric: Patient has normal mood and affect    Ortho Exam: Orthopedic exam demonstrates full active and passive range of motion of the right knee.  There is no effusion.  Collateral and cruciate ligaments are stable.  There is some retropatellar and periretinacular tenderness to palpation of the right compared to the left.  No discrete medial or lateral joint line tenderness present.  Range of motion is full.  Pedal pulses palpable.  Specialty Comments:  No specialty comments available.  Imaging: No results found.   PMFS History: Patient Active Problem List  Diagnosis Date Noted  . DEPRESSION 06/18/2007  . ASTHMA 06/18/2007  . SEIZURE DISORDER 06/18/2007   Past Medical History:  Diagnosis Date  . Asthma   . Depression     History reviewed. No pertinent family history.  History reviewed. No pertinent surgical history. Social History   Occupational History  . Not on file  Tobacco Use  . Smoking status: Never Smoker  Substance and Sexual Activity  . Alcohol use: Yes  . Drug use: No  . Sexual activity: Not on file

## 2018-04-21 ENCOUNTER — Encounter (INDEPENDENT_AMBULATORY_CARE_PROVIDER_SITE_OTHER): Payer: Self-pay | Admitting: Orthopedic Surgery

## 2018-04-21 ENCOUNTER — Ambulatory Visit (INDEPENDENT_AMBULATORY_CARE_PROVIDER_SITE_OTHER): Payer: 59 | Admitting: Orthopedic Surgery

## 2018-04-21 DIAGNOSIS — M25561 Pain in right knee: Secondary | ICD-10-CM | POA: Diagnosis not present

## 2018-04-26 ENCOUNTER — Encounter (INDEPENDENT_AMBULATORY_CARE_PROVIDER_SITE_OTHER): Payer: Self-pay | Admitting: Orthopedic Surgery

## 2018-04-26 NOTE — Progress Notes (Signed)
   Office Visit Note   Patient: Leslie Thompson           Date of Birth: [birth year]           MRN: [id number] Visit Date: 04/21/2018 Requested by: Laurann MontanaWhite, Cynthia, MD [phone] [place], [place] [address] PCP: Laurann MontanaWhite, Cynthia, MD  Subjective: Chief Complaint  Patient presents with  . Right Knee - Follow-up    HPI: Leslie Thompson is a patient with right knee pain.  Injection 03/25/2018 did not really give her any relief.  Localizing the pain to the patellofemoral region.  Been going on for several months.  She had been doing a lot of breaststroke and this did bother the knee some as well.  She is able to get around at school.  Hard for her to keep the knee in one position for a long period of time.  Takes ibuprofen for her symptoms.  She does report a discrete mechanical type feeling in the knee.              ROS: All systems reviewed are negative as they relate to the chief complaint within the history of present illness.  Patient denies  fevers or chills.   Assessment & Plan: Visit Diagnoses:  1. Acute pain of right knee     Plan: Impression is right knee pain.  I think she may have something going on in the patellofemoral joint.  Plan is MRI right knee due to failure of conservative treatment.  I will see her back after that study.  Follow-Up Instructions: Return for after MRI.   Orders:  Orders Placed This Encounter  Procedures  . MR Knee Right w/o contrast   No orders of the defined types were placed in this encounter.     Procedures: No procedures performed   Clinical Data: No additional findings.  Objective: Vital Signs: There were no vitals taken for this visit.  Physical Exam:   Constitutional: Patient appears well-developed HEENT:  Head: Normocephalic Eyes:EOM are normal Neck: Normal range of motion Cardiovascular: Normal rate Pulmonary/chest: Effort normal Neurologic: Patient is alert Skin: Skin is warm Psychiatric: Patient has normal mood and  affect    Ortho Exam: Ortho exam demonstrates full active and passive range of motion of that right knee.  She does have some patellofemoral crepitus.  Medial and lateral joint lines nontender.  Collateral and cruciate ligament stable.  No other masses lymphadenopathy or skin changes noted in the right knee region.  Specialty Comments:  No specialty comments available.  Imaging: No results found.   PMFS History: Patient Active Problem List   Diagnosis Date Noted  . DEPRESSION 06/18/2007  . ASTHMA 06/18/2007  . SEIZURE DISORDER 06/18/2007   Past Medical History:  Diagnosis Date  . Asthma   . Depression     History reviewed. No pertinent family history.  History reviewed. No pertinent surgical history. Social History   Occupational History  . Not on file  Tobacco Use  . Smoking status: Never Smoker  . Smokeless tobacco: Never Used  Substance and Sexual Activity  . Alcohol use: Yes  . Drug use: No  . Sexual activity: Not on file

## 2018-05-09 ENCOUNTER — Ambulatory Visit
Admission: RE | Admit: 2018-05-09 | Discharge: 2018-05-09 | Disposition: A | Discharge status: Home / Self Care | Payer: 59 | Source: Ambulatory Visit | Attending: Orthopedic Surgery | Admitting: Orthopedic Surgery

## 2018-05-09 DIAGNOSIS — M25561 Pain in right knee: Secondary | ICD-10-CM

## 2018-05-14 ENCOUNTER — Ambulatory Visit (INDEPENDENT_AMBULATORY_CARE_PROVIDER_SITE_OTHER): Payer: 59 | Admitting: Orthopedic Surgery

## 2018-05-14 ENCOUNTER — Encounter (INDEPENDENT_AMBULATORY_CARE_PROVIDER_SITE_OTHER): Payer: Self-pay | Admitting: Orthopedic Surgery

## 2018-05-14 DIAGNOSIS — M25561 Pain in right knee: Secondary | ICD-10-CM | POA: Diagnosis not present

## 2018-05-18 ENCOUNTER — Encounter (INDEPENDENT_AMBULATORY_CARE_PROVIDER_SITE_OTHER): Payer: Self-pay | Admitting: Orthopedic Surgery

## 2018-05-18 NOTE — Progress Notes (Signed)
   Office Visit Note   Patient: Leslie Thompson           Date of Birth: 12/09/1985           MRN: [id number] Visit Date: 05/14/2018 Requested by: Laurann MontanaWhite, Cynthia, MD [phone] [place], [place] [address] PCP: Laurann MontanaWhite, Cynthia, MD  Subjective: Chief Complaint  Patient presents with  . Right Knee - Follow-up    HPI: Leslie EndoKelly presents for follow-up of right knee.  Since I have seen her she is had an MRI scan which is reviewed.  That scan was essentially normal.  Nothing really in the retropatellar region where she is having mechanical symptoms.  She did have an injection which did not help.  She states the pain is noticeable.  She is only done walking and swimming as her rehabilitation.  She is still having difficulty with the right knee.              ROS: All systems reviewed are negative as they relate to the chief complaint within the history of present illness.  Patient denies  fevers or chills.   Assessment & Plan: Visit Diagnoses:  1. Acute pain of right knee     Plan: Impression is right knee pain with fairly benign MRI scan with nothing structural behind the patella.  Plan is physical therapy and anti-inflammatories.  I think we could try one more injection prior to arthroscopic evaluation.  Arthroscopy would be a last resort in this case.  I do think in general quad strengthening would have some benefits in terms of alleviating some of the anterior knee pain.  Mechanical symptoms may be due to an inflamed plica which potentially cannot show up on the MRI scan.  Again I would favor another injection if it comes to the point where she requires intervention.  Follow-Up Instructions: Return if symptoms worsen or fail to improve.   Orders:  No orders of the defined types were placed in this encounter.  No orders of the defined types were placed in this encounter.     Procedures: No procedures performed   Clinical Data: No additional findings.  Objective: Vital  Signs: There were no vitals taken for this visit.  Physical Exam:   Constitutional: Patient appears well-developed HEENT:  Head: Normocephalic Eyes:EOM are normal Neck: Normal range of motion Cardiovascular: Normal rate Pulmonary/chest: Effort normal Neurologic: Patient is alert Skin: Skin is warm Psychiatric: Patient has normal mood and affect    Ortho Exam: Ortho exam demonstrates full active and passive range of motion stable collateral cruciate ligaments and intact extensor mechanism.  No other masses lymphadenopathy or skin changes noted in that right knee region.  Negative patellar apprehension.  There is some periretinacular tenderness.  Specialty Comments:  No specialty comments available.  Imaging: No results found.   PMFS History: Patient Active Problem List   Diagnosis Date Noted  . DEPRESSION 06/18/2007  . ASTHMA 06/18/2007  . SEIZURE DISORDER 06/18/2007   Past Medical History:  Diagnosis Date  . Asthma   . Depression     History reviewed. No pertinent family history.  History reviewed. No pertinent surgical history. Social History   Occupational History  . Not on file  Tobacco Use  . Smoking status: Never Smoker  . Smokeless tobacco: Never Used  Substance and Sexual Activity  . Alcohol use: Yes  . Drug use: No  . Sexual activity: Not on file

## 2019-03-02 ENCOUNTER — Other Ambulatory Visit: Payer: Self-pay | Admitting: Family Medicine

## 2019-03-02 ENCOUNTER — Ambulatory Visit
Admission: RE | Admit: 2019-03-02 | Discharge: 2019-03-02 | Disposition: A | Discharge status: Home / Self Care | Payer: 59 | Source: Ambulatory Visit | Attending: Family Medicine | Admitting: Family Medicine

## 2019-03-02 ENCOUNTER — Other Ambulatory Visit: Payer: Self-pay

## 2019-03-02 DIAGNOSIS — R1031 Right lower quadrant pain: Secondary | ICD-10-CM

## 2019-03-02 MED ORDER — IOPAMIDOL (ISOVUE-300) INJECTION 61%
100.0000 mL | Freq: Once | INTRAVENOUS | Status: AC | PRN
  Administered 2019-03-02: 100 mL via INTRAVENOUS

## 2019-03-03 ENCOUNTER — Other Ambulatory Visit: Payer: Self-pay | Admitting: Family Medicine

## 2019-03-03 ENCOUNTER — Other Ambulatory Visit: Payer: Self-pay

## 2019-03-03 DIAGNOSIS — N83201 Unspecified ovarian cyst, right side: Secondary | ICD-10-CM

## 2019-03-12 ENCOUNTER — Other Ambulatory Visit: Payer: Self-pay

## 2019-03-12 ENCOUNTER — Ambulatory Visit
Admission: RE | Admit: 2019-03-12 | Discharge: 2019-03-12 | Disposition: A | Discharge status: Home / Self Care | Payer: 59 | Source: Ambulatory Visit | Attending: Family Medicine | Admitting: Family Medicine

## 2019-03-12 DIAGNOSIS — N83201 Unspecified ovarian cyst, right side: Secondary | ICD-10-CM

## 2019-03-17 ENCOUNTER — Other Ambulatory Visit: Payer: Self-pay | Admitting: Family Medicine

## 2019-03-17 DIAGNOSIS — N83201 Unspecified ovarian cyst, right side: Secondary | ICD-10-CM

## 2019-04-07 ENCOUNTER — Other Ambulatory Visit: Payer: 59

## 2019-04-30 ENCOUNTER — Ambulatory Visit
Admission: RE | Admit: 2019-04-30 | Discharge: 2019-04-30 | Disposition: A | Discharge status: Home / Self Care | Payer: 59 | Source: Ambulatory Visit | Attending: Family Medicine | Admitting: Family Medicine

## 2019-04-30 DIAGNOSIS — N83201 Unspecified ovarian cyst, right side: Secondary | ICD-10-CM

## 2019-05-20 ENCOUNTER — Other Ambulatory Visit (HOSPITAL_COMMUNITY)
Admission: RE | Admit: 2019-05-20 | Discharge: 2019-05-20 | Disposition: A | Discharge status: Home / Self Care | Payer: 59 | Source: Ambulatory Visit | Attending: Family Medicine | Admitting: Family Medicine

## 2019-05-20 ENCOUNTER — Other Ambulatory Visit: Payer: Self-pay | Admitting: Family Medicine

## 2019-05-20 DIAGNOSIS — Z124 Encounter for screening for malignant neoplasm of cervix: Secondary | ICD-10-CM | POA: Insufficient documentation

## 2019-05-22 LAB — CYTOLOGY - PAP: Diagnosis: NEGATIVE

## 2019-08-23 DIAGNOSIS — Z20828 Contact with and (suspected) exposure to other viral communicable diseases: Secondary | ICD-10-CM | POA: Diagnosis not present

## 2019-09-26 LAB — COVID-19 CARE EVERYWHERE: COVID-19 CARE EVERYWHERE: NOT DETECTED

## 2019-11-17 DIAGNOSIS — Z20828 Contact with and (suspected) exposure to other viral communicable diseases: Secondary | ICD-10-CM | POA: Diagnosis not present

## 2020-02-18 ENCOUNTER — Emergency Department (HOSPITAL_COMMUNITY)
Admission: EM | Admit: 2020-02-18 | Discharge: 2020-02-18 | Disposition: A | Discharge status: Home / Self Care | Payer: BC Managed Care – PPO | Attending: Emergency Medicine | Admitting: Emergency Medicine

## 2020-02-18 DIAGNOSIS — I10 Essential (primary) hypertension: Secondary | ICD-10-CM | POA: Diagnosis not present

## 2020-02-18 DIAGNOSIS — T7840XA Allergy, unspecified, initial encounter: Secondary | ICD-10-CM | POA: Diagnosis not present

## 2020-02-18 DIAGNOSIS — F419 Anxiety disorder, unspecified: Secondary | ICD-10-CM | POA: Diagnosis not present

## 2020-02-18 DIAGNOSIS — R221 Localized swelling, mass and lump, neck: Secondary | ICD-10-CM | POA: Diagnosis not present

## 2020-02-18 DIAGNOSIS — R202 Paresthesia of skin: Secondary | ICD-10-CM | POA: Diagnosis not present

## 2020-02-18 DIAGNOSIS — J45909 Unspecified asthma, uncomplicated: Secondary | ICD-10-CM | POA: Diagnosis not present

## 2020-02-18 DIAGNOSIS — T50B95A Adverse effect of other viral vaccines, initial encounter: Secondary | ICD-10-CM | POA: Diagnosis not present

## 2020-02-18 DIAGNOSIS — Z79899 Other long term (current) drug therapy: Secondary | ICD-10-CM | POA: Diagnosis not present

## 2020-02-18 DIAGNOSIS — T50Z95A Adverse effect of other vaccines and biological substances, initial encounter: Secondary | ICD-10-CM | POA: Insufficient documentation

## 2020-02-18 DIAGNOSIS — R002 Palpitations: Secondary | ICD-10-CM | POA: Diagnosis not present

## 2020-02-18 LAB — I-STAT CHEM 8, ED
BUN: 20 mg/dL (ref 6–20)
Calcium, Ion: 1.29 mmol/L (ref 1.15–1.40)
Chloride: 106 mmol/L (ref 98–111)
Creatinine, Ser: 0.5 mg/dL (ref 0.44–1.00)
Glucose, Bld: 94 mg/dL (ref 70–99)
HCT: 45 % (ref 36.0–46.0)
Hemoglobin: 15.3 g/dL — ABNORMAL HIGH (ref 12.0–15.0)
Potassium: 5 mmol/L (ref 3.5–5.1)
Sodium: 142 mmol/L (ref 135–145)
TCO2: 28 mmol/L (ref 22–32)

## 2020-02-18 LAB — CBC WITH DIFFERENTIAL/PLATELET
Abs Immature Granulocytes: 0.05 10*3/uL (ref 0.00–0.07)
Basophils Absolute: 0 10*3/uL (ref 0.0–0.1)
Basophils Relative: 0 %
Eosinophils Absolute: 0 10*3/uL (ref 0.0–0.5)
Eosinophils Relative: 0 %
HCT: 43.8 % (ref 36.0–46.0)
Hemoglobin: 15 g/dL (ref 12.0–15.0)
Immature Granulocytes: 0 %
Lymphocytes Relative: 8 %
Lymphs Abs: 1 10*3/uL (ref 0.7–4.0)
MCH: 32.1 pg (ref 26.0–34.0)
MCHC: 34.2 g/dL (ref 30.0–36.0)
MCV: 93.8 fL (ref 80.0–100.0)
Monocytes Absolute: 0.6 10*3/uL (ref 0.1–1.0)
Monocytes Relative: 4 %
Neutro Abs: 11 10*3/uL — ABNORMAL HIGH (ref 1.7–7.7)
Neutrophils Relative %: 88 %
Platelets: 203 10*3/uL (ref 150–400)
RBC: 4.67 MIL/uL (ref 3.87–5.11)
RDW: 12.1 % (ref 11.5–15.5)
WBC: 12.6 10*3/uL — ABNORMAL HIGH (ref 4.0–10.5)
nRBC: 0 % (ref 0.0–0.2)

## 2020-02-18 LAB — I-STAT BETA HCG BLOOD, ED (MC, WL, AP ONLY): I-stat hCG, quantitative: <5 m[IU]/mL (ref <5)

## 2020-02-18 MED ORDER — METHYLPREDNISOLONE SODIUM SUCC 125 MG IJ SOLR
125.0000 mg | Freq: Once | INTRAMUSCULAR | Status: AC
  Administered 2020-02-18: 15:00:00 125 mg via INTRAVENOUS
  Filled 2020-02-18: qty 2

## 2020-02-18 MED ORDER — DIPHENHYDRAMINE HCL 25 MG PO TABS: 25.0000 mg | ORAL_TABLET | Freq: Four times a day (QID) | ORAL | 0 refills | Status: DC | PRN

## 2020-02-18 MED ORDER — SODIUM CHLORIDE 0.9 % IV BOLUS
1000.0000 mL | Freq: Once | INTRAVENOUS | Status: AC
  Administered 2020-02-18: 14:00:00 1000 mL via INTRAVENOUS

## 2020-02-18 MED ORDER — FAMOTIDINE 20 MG PO TABS: 20.0000 mg | ORAL_TABLET | Freq: Every day | ORAL | 0 refills | Status: DC

## 2020-02-18 MED ORDER — PREDNISONE 20 MG PO TABS: 40.0000 mg | ORAL_TABLET | Freq: Every day | ORAL | 0 refills | Status: AC

## 2020-02-18 MED ORDER — EPINEPHRINE 0.3 MG/0.3ML IJ SOAJ: 0.3000 mg | INTRAMUSCULAR | 0 refills | Status: DC | PRN

## 2020-02-18 MED ORDER — FAMOTIDINE IN NACL 20-0.9 MG/50ML-% IV SOLN
20.0000 mg | Freq: Once | INTRAVENOUS | Status: AC
  Administered 2020-02-18: 15:00:00 20 mg via INTRAVENOUS
  Filled 2020-02-18: qty 50

## 2020-02-18 NOTE — ED Provider Notes (Signed)
Care assumed from Domenic Moras, PA-C at shift change. See his note for full HPI.  In short, patient is a 34 year old female with a past medical history significant for asthma, depression, allergies to penicillin and Z-Pak who presents to the ED due to reported allergic reaction for recent Covid vaccine.  Patient notes she had her second Pfizer vaccine at 11:30 AM this morning.  Shortly after administration, patient developed heart palpitations, tingling sensation to her lips and tongue, and feelings that her throat was swelling.  Patient was given oral Benadryl without relief.  She then received a dose of EpiPen and was monitored without any significant improvements.  She then received another dose of EpiPen and was sent here for further management.  She notes after her first vaccine, she felt some heart palpitations and throat discomfort which improved with Zyrtec.  Patient denies rash abdominal pain, nausea, vomiting, or diarrhea.  Upon arrival, patient notes her palpitations and trouble breathing improved;, she notes tightness in her throat consisted.  Plan from previous provider: monitor patient given 2 doses of epi and reassess. If patient feels better, she may be discharged with prescription for epipen, benadryl, and pepcid.   Physical Exam  BP (!) 123/92   Pulse 96   Temp 98.2 F (36.8 C) (Oral)   Resp 16   SpO2 99%   Physical Exam Vitals and nursing note reviewed.  Constitutional:      General: She is not in acute distress.    Appearance: She is not ill-appearing.  HENT:     Head: Normocephalic.     Mouth/Throat:     Comments: Airway patent Eyes:     Pupils: Pupils are equal, round, and reactive to light.  Cardiovascular:     Rate and Rhythm: Normal rate and regular rhythm.     Pulses: Normal pulses.     Heart sounds: Normal heart sounds. No murmur. No friction rub. No gallop.   Pulmonary:     Effort: Pulmonary effort is normal.     Breath sounds: Normal breath sounds. No  stridor.     Comments: Respirations equal and unlabored, patient able to speak in full sentences, lungs clear to auscultation bilaterally Abdominal:     General: Abdomen is flat. Bowel sounds are normal. There is no distension.     Palpations: Abdomen is soft.     Tenderness: There is no abdominal tenderness. There is no guarding or rebound.  Musculoskeletal:     Cervical back: Neck supple.     Comments: Able to move all 4 extremities without difficulty.   Skin:    General: Skin is warm and dry.  Neurological:     General: No focal deficit present.     Mental Status: She is alert.  Psychiatric:        Mood and Affect: Mood normal.        Behavior: Behavior normal.     ED Course/Procedures     Procedures  MDM  Care assumed from Domenic Moras, PA-C at shift change. See his note for full MDM.  34 year old female presents to the ED due to possible allergic reaction after her second Covid vaccine.  After patient received her second Covid vaccine, she admits to heart palpitations tingling in her mouth as well as throat swelling.  She received 2 rounds of epi and Benadryl prior to arrival.  Patient already given Solu-Medrol, Pepcid, and IV fluids here in the ER.  4:03 PM reassessed patient at bedside and she still notes  she feels like her throat is tighter than normal.  Discussed case with Dr. Orland Dec who evaluated patient at bedside.  Airway patent.  Patient speaking in full sentences.  No stridor on exam.  No signs of airway compromise.  Suspect underlying anxiety adding to her symptoms.  We will continue to observe here in the ER.  Patient continues not to have any GI symptoms or rash.  No further appendectomy needed at this time.  5:40 PM Reassessed patient at bedside and she notes her symptoms have improved and is ready to go home. Lungs clear to ascultation bilaterally, no stridor. Patient speaking in full sentences. Will discharge patient with steroids, pepcid, and benadryl. Will give  prescription for EpiPen. Advised patient that if she uses the EpiPen she will need to return to the ER. Patient instructed to follow-up with PCP if symptoms do not improve within the next few days. Strict ED precautions discussed with patient. Patient states understanding and agrees to plan. Patient discharged home in no acute distress and stable vitals         Jesusita Oka 02/18/20 1744    Sabas Sous, MD 02/19/20 [phone]

## 2020-02-18 NOTE — ED Provider Notes (Signed)
[facility] Provider Note   CSN: [id number] Arrival date & time: 02/18/20  1254     History Chief Complaint  Patient presents with  . Allergic Reaction    Leslie Thompson is a 34 y.o. female.  The history is provided by the patient. No language interpreter was used.     34 year old female with history of asthma, depression, prior allergies to penicillin and Z-Pak presenting for allergic reaction from recent Covid vaccine.  Patient states she received her second dose of Pfizer Covid vaccine approximately 2 hours ago.  Shortly after the administration of the medication she developed heart palpitation, feeling anxious, tingling sensation to her lips and tongue as well as sensation of throat swelling.  She was first given oral Benadryl without any relief.  She received a dose of EpiPen and was monitored for short amount of time without any significant improvement.  She received another dose of EpiPen and subsequently was brought here for further management.  She recall during her first dose of vaccine a few hours later she did experience some palpitation and throat discomfort which improved after she took Zyrtec.  She initially thought it was related to allergies.  She denies any other environmental changes or medication changes prior to the second vaccine today.  She does not complaining of skin rash, abdominal cramping, nausea or vomiting.  She report her heart palpitation and trouble breathing seems to be improved however, she still endorse tightness around her throat.  Past Medical History:  Diagnosis Date  . Asthma   . Depression     Patient Active Problem List   Diagnosis Date Noted  . DEPRESSION 06/18/2007  . ASTHMA 06/18/2007  . SEIZURE DISORDER 06/18/2007    No past surgical history on file.   OB History   No obstetric history on file.     No family history on file.  Social History   Tobacco Use  . Smoking status: Never Smoker  .  Smokeless tobacco: Never Used  Substance Use Topics  . Alcohol use: Yes  . Drug use: No    Home Medications Prior to Admission medications   Medication Sig Start Date End Date Taking? Authorizing Provider  albuterol (PROVENTIL HFA;VENTOLIN HFA) 108 (90 BASE) MCG/ACT inhaler Inhale 2 puffs into the lungs every 6 (six) hours as needed for wheezing.    [provider]  escitalopram (LEXAPRO) 10 MG tablet Take 20 mg by mouth daily.    [provider]  ondansetron (ZOFRAN) 8 MG tablet Take 1 tablet (8 mg total) by mouth every 8 (eight) hours as needed for nausea. 11/06/13   Zadie Rhine, MD    Allergies    Penicillins  Review of Systems   Review of Systems  All other systems reviewed and are negative.   Physical Exam Updated Vital Signs BP 138/84 (BP Location: Left Arm)   Pulse 85   Temp 98.2 F (36.8 C) (Oral)   Resp 19   SpO2 100%   Physical Exam Vitals and nursing note reviewed.  Constitutional:      General: She is not in acute distress.    Appearance: She is well-developed.  HENT:     Head: Atraumatic.     Comments: Patient speaking slowly, appears to be in some mild discomfort but able to tolerate oral secretion.    Mouth/Throat:     Comments: No tongue swelling, no uvula swelling or mucosal swelling. Eyes:     Conjunctiva/sclera: Conjunctivae normal.  Cardiovascular:  Rate and Rhythm: Normal rate and regular rhythm.     Pulses: Normal pulses.     Heart sounds: Normal heart sounds.  Pulmonary:     Effort: Pulmonary effort is normal.     Breath sounds: Normal breath sounds. No wheezing.  Abdominal:     Palpations: Abdomen is soft.     Tenderness: There is no abdominal tenderness.  Musculoskeletal:     Cervical back: Normal range of motion and neck supple.  Skin:    Findings: No rash.  Neurological:     Mental Status: She is alert and oriented to person, place, and time.  Psychiatric:        Mood and Affect: Mood normal.     ED  Results / Procedures / Treatments   Labs (all labs ordered are listed, but only abnormal results are displayed) Labs Reviewed  I-STAT CHEM 8, ED - Abnormal; Notable for the following components:      Result Value   Hemoglobin 15.3 (*)    All other components within normal limits  CBC WITH DIFFERENTIAL/PLATELET  I-STAT BETA HCG BLOOD, ED ([facility], [facility], [facility] ONLY)    EKG None  Radiology No results found.  Procedures Procedures (including critical care time)  Medications Ordered in ED Medications  sodium chloride 0.9 % bolus 1,000 mL (1,000 mLs Intravenous New Bag/Given 02/18/20 1429)  methylPREDNISolone sodium succinate (SOLU-MEDROL) 125 mg/2 mL injection 125 mg (125 mg Intravenous Given 02/18/20 1430)  famotidine (PEPCID) IVPB 20 mg premix (20 mg Intravenous New Bag/Given 02/18/20 1430)    ED Course  I have reviewed the triage vital signs and the nursing notes.  Pertinent labs & imaging results that were available during my care of the patient were reviewed by me and considered in my medical decision making (see chart for details).    MDM Rules/Calculators/A&P                      BP (!) 123/92   Pulse 96   Temp 98.2 F (36.8 C) (Oral)   Resp 16   SpO2 99%   Final Clinical Impression(s) / ED Diagnoses Final diagnoses:  Allergic reaction, initial encounter  Adverse effect of vaccine, initial encounter    Rx / DC Orders ED Discharge Orders    None     Leslie Thompson was evaluated in Emergency Department on 02/18/2020 for the symptoms described in the history of present illness. She was evaluated in the context of the global COVID-19 pandemic, which necessitated consideration that the patient might be at risk for infection with the SARS-CoV-2 virus that causes COVID-19. Institutional protocols and algorithms that pertain to the evaluation of patients at risk for COVID-19 are in a state of rapid change based on information released by regulatory bodies including the CDC and  federal and state organizations. These policies and algorithms were followed during the patient's care in the ED.  1:41 PM Patient developed heart palpitation, tingling to her mouth as well as trouble swallowing and throat swelling.  This started shortly after receiving a second dose of [employer].  She has received a total of 2 EpiPen injection as well as Benadryl prior to arrival.  Will give Solu-Medrol, Pepcid, IV fluid, and will monitor for an additional 4 hours.  GIven her symptoms, i'm not convince pt truly has an allergy to the vaccine.  However, due to administration of epipen, will need to monitor for 4 hrs and reassess.  Pt sign out  to oncoming team who will continue monitoring and will reassess.     Domenic Moras, PA-C 02/18/20 [place], [place], DO 02/18/20 [phone]

## 2020-02-18 NOTE — ED Triage Notes (Signed)
Received 2nd COVID vaccine today, approximately 40 minutes prior to arrival. Patient immediately started to report tingling to throat and tongue plus palpitations. VSS. Staff at [facility] facility administered 2 EPI injections and 50 mg of Benadryl PO.

## 2020-02-18 NOTE — Discharge Instructions (Addendum)
As discussed, I am sending you home with steroids, Pepcid, and Benadryl. Start the steroids tomorrow morning. I am also sending you home with an epipen. If you use the epipen, you must return to the ER. Return to the ER if you develop difficulties breathing or feelings that your throat is closing up. Return to the ER for new or worsening symptoms.

## 2020-02-19 ENCOUNTER — Other Ambulatory Visit: Payer: Self-pay

## 2020-02-19 ENCOUNTER — Encounter (HOSPITAL_COMMUNITY): Payer: Self-pay

## 2020-02-19 ENCOUNTER — Emergency Department (HOSPITAL_COMMUNITY)
Admission: EM | Admit: 2020-02-19 | Discharge: 2020-02-19 | Disposition: A | Discharge status: Home / Self Care | Payer: BC Managed Care – PPO | Attending: Emergency Medicine | Admitting: Emergency Medicine

## 2020-02-19 ENCOUNTER — Emergency Department (HOSPITAL_COMMUNITY): Payer: BC Managed Care – PPO

## 2020-02-19 DIAGNOSIS — T7840XA Allergy, unspecified, initial encounter: Secondary | ICD-10-CM | POA: Diagnosis not present

## 2020-02-19 DIAGNOSIS — R002 Palpitations: Secondary | ICD-10-CM | POA: Diagnosis not present

## 2020-02-19 DIAGNOSIS — Z79899 Other long term (current) drug therapy: Secondary | ICD-10-CM | POA: Diagnosis not present

## 2020-02-19 DIAGNOSIS — J45909 Unspecified asthma, uncomplicated: Secondary | ICD-10-CM | POA: Insufficient documentation

## 2020-02-19 DIAGNOSIS — R42 Dizziness and giddiness: Secondary | ICD-10-CM | POA: Diagnosis not present

## 2020-02-19 DIAGNOSIS — R531 Weakness: Secondary | ICD-10-CM | POA: Diagnosis not present

## 2020-02-19 MED ORDER — LORAZEPAM 2 MG/ML IJ SOLN
0.5000 mg | Freq: Once | INTRAMUSCULAR | Status: AC
  Administered 2020-02-19: 0.5 mg via INTRAVENOUS
  Filled 2020-02-19: qty 1

## 2020-02-19 MED ORDER — DIPHENHYDRAMINE HCL 50 MG/ML IJ SOLN
25.0000 mg | Freq: Once | INTRAMUSCULAR | Status: AC
  Administered 2020-02-19: 08:00:00 25 mg via INTRAVENOUS
  Filled 2020-02-19: qty 1

## 2020-02-19 MED ORDER — FAMOTIDINE IN NACL 20-0.9 MG/50ML-% IV SOLN
20.0000 mg | Freq: Once | INTRAVENOUS | Status: AC
  Administered 2020-02-19: 08:00:00 20 mg via INTRAVENOUS
  Filled 2020-02-19: qty 50

## 2020-02-19 MED ORDER — METHYLPREDNISOLONE SODIUM SUCC 125 MG IJ SOLR
125.0000 mg | Freq: Once | INTRAMUSCULAR | Status: AC
  Administered 2020-02-19: 08:00:00 125 mg via INTRAVENOUS
  Filled 2020-02-19: qty 2

## 2020-02-19 NOTE — ED Triage Notes (Signed)
Pt seen earlier for reaction to covid vaccination. Pt states shob that reoccurred again at 0100. Tongue feels like it is tingling and throat feels tight.

## 2020-02-19 NOTE — ED Provider Notes (Signed)
[facility]-EMERGENCY DEPT Provider Note   CSN: [id number] Arrival date & time: 02/19/20  0229     History Chief Complaint  Patient presents with  . Allergic Reaction    Leslie Thompson is a 34 y.o. female with a past medical history of depression, asthma, seasonal allergies presenting to the ED with continued allergic reaction.  Patient received her second Covid vaccine yesterday at 11:30 AM.  She notes shortly after she developed palpitations, tingling sensation on her lips and tongue and feeling that her throat was "closing up."  She was given 2 doses of epi at the pharmacy as well as Benadryl.  She was sent to the ED after this yesterday. She states she felt better after observation here in the ED.  She went home and approximately 6 hours ago took another dose of Benadryl.  She then continued to have symptoms similar to yesterday, tingling sensation on lips and tongue and feeling that her throat was swollen especially on the right side.  She has an EpiPen she did not administer it because "someone told me you should not take that much epi."  She has not taken her second dose of steroids today.  She denies any lip swelling, tongue swelling, wheezing or stridor, vomiting, abdominal pain, chest pain. States that her reaction to the first Covid vaccine was minimal and improved after 1 dose of an antihistamine.  HPI     Past Medical History:  Diagnosis Date  . Asthma   . Depression     Patient Active Problem List   Diagnosis Date Noted  . DEPRESSION 06/18/2007  . ASTHMA 06/18/2007  . SEIZURE DISORDER 06/18/2007    History reviewed. No pertinent surgical history.   OB History   No obstetric history on file.     No family history on file.  Social History   Tobacco Use  . Smoking status: Never Smoker  . Smokeless tobacco: Never Used  Substance Use Topics  . Alcohol use: Yes  . Drug use: No    Home Medications Prior to Admission medications    Medication Sig Start Date End Date Taking? Authorizing Provider  albuterol (PROVENTIL HFA;VENTOLIN HFA) 108 (90 BASE) MCG/ACT inhaler Inhale 2 puffs into the lungs every 6 (six) hours as needed for wheezing.   Yes [provider]  diphenhydrAMINE (BENADRYL) 25 MG tablet Take 1 tablet (25 mg total) by mouth every 6 (six) hours as needed. Patient taking differently: Take 25 mg by mouth every 6 (six) hours as needed for itching or allergies.  02/18/20  Yes Aberman, Merla Riches, PA-C  EPINEPHrine (EPIPEN 2-PAK) 0.3 mg/0.3 mL IJ SOAJ injection Inject 0.3 mLs (0.3 mg total) into the muscle as needed for anaphylaxis. 02/18/20  Yes Aberman, Caroline C, PA-C  loratadine (CLARITIN) 10 MG tablet Take 10 mg by mouth daily.   Yes [provider]  famotidine (PEPCID) 20 MG tablet Take 1 tablet (20 mg total) by mouth daily for 5 days. 02/18/20 02/23/20  Mannie Stabile, PA-C  ondansetron (ZOFRAN) 8 MG tablet Take 1 tablet (8 mg total) by mouth every 8 (eight) hours as needed for nausea. Patient not taking: Reported on 02/19/2020 11/06/13   Zadie Rhine, MD  predniSONE (DELTASONE) 20 MG tablet Take 2 tablets (40 mg total) by mouth daily for 4 days. 02/18/20 02/22/20  Mannie Stabile, PA-C    Allergies    Amaranth (fd&c red #2), Azithromycin, Penicillins, and Naproxen  Review of Systems   Review of  Systems  Constitutional: Negative for appetite change, chills and fever.  HENT: Negative for ear pain, rhinorrhea, sneezing and sore throat.        +tingling of tongue and lips  Eyes: Negative for photophobia and visual disturbance.  Respiratory: Negative for cough, chest tightness, shortness of breath and wheezing.   Cardiovascular: Positive for palpitations. Negative for chest pain.  Gastrointestinal: Negative for abdominal pain, blood in stool, constipation, diarrhea, nausea and vomiting.  Genitourinary: Negative for dysuria, hematuria and urgency.  Musculoskeletal: Negative for myalgias.   Skin: Negative for rash.  Neurological: Negative for dizziness, weakness and light-headedness.    Physical Exam Updated Vital Signs BP 116/76   Pulse 73   Temp 98.2 F (36.8 C) (Oral)   Resp 17   Ht 5\' 3"  (1.6 m)   Wt 65.8 kg   SpO2 98%   BMI 25.69 kg/m   Physical Exam Vitals and nursing note reviewed.  Constitutional:      General: She is not in acute distress.    Appearance: She is well-developed.     Comments: Speaking complete sentences without difficulty.  No signs of angioedema or anaphylaxis.  No signs of respiratory distress.  HENT:     Head: Normocephalic and atraumatic.     Nose: Nose normal.  Eyes:     General: No scleral icterus.       Right eye: No discharge.        Left eye: No discharge.     Conjunctiva/sclera: Conjunctivae normal.  Cardiovascular:     Rate and Rhythm: Normal rate and regular rhythm.     Heart sounds: Normal heart sounds. No murmur. No friction rub. No gallop.   Pulmonary:     Effort: Pulmonary effort is normal. No respiratory distress.     Breath sounds: Normal breath sounds.  Abdominal:     General: Bowel sounds are normal. There is no distension.     Palpations: Abdomen is soft.     Tenderness: There is no abdominal tenderness. There is no guarding.  Musculoskeletal:        General: Normal range of motion.     Cervical back: Normal range of motion and neck supple.  Skin:    General: Skin is warm and dry.     Findings: No rash.     Comments: No urticaria noted.  No rash.  Neurological:     Mental Status: She is alert.     Motor: No abnormal muscle tone.     Coordination: Coordination normal.     ED Results / Procedures / Treatments   Labs (all labs ordered are listed, but only abnormal results are displayed) Labs Reviewed - No data to display  EKG None  Radiology DG Chest Portable 1 View  Result Date: 02/19/2020 CLINICAL DATA:  COVID vaccine yesterday with reaction including throat tightening and dizziness as well  as weakness. Patient states return of symptoms last night and this morning. EXAM: PORTABLE CHEST 1 VIEW COMPARISON:  03/04/2011 FINDINGS: Lungs are adequately inflated and otherwise clear. Cardiomediastinal silhouette and remainder of the exam is unchanged. Bone island over the left humeral neck. IMPRESSION: No active disease. Electronically Signed   By: 03/06/2011 M.D.   On: 02/19/2020 10:37    Procedures Procedures (including critical care time)  Medications Ordered in ED Medications  methylPREDNISolone sodium succinate (SOLU-MEDROL) 125 mg/2 mL injection 125 mg (125 mg Intravenous Given 02/19/20 0751)  famotidine (PEPCID) IVPB 20 mg premix (0 mg Intravenous Stopped 02/19/20  5009)  diphenhydrAMINE (BENADRYL) injection 25 mg (25 mg Intravenous Given 02/19/20 0751)  LORazepam (ATIVAN) injection 0.5 mg (0.5 mg Intravenous Given 02/19/20 0843)    ED Course  I have reviewed the triage vital signs and the nursing notes.  Pertinent labs & imaging results that were available during my care of the patient were reviewed by me and considered in my medical decision making (see chart for details).   34 year old female with a past medical history of depression, asthma and seasonal allergies presenting to the ED with continued allergic reaction.  She received her second Pfizer Covid vaccine yesterday at 11:30 AM.  Due to her ongoing palpitations, tingling sensation in lips and tongue and feeling like her throat was swollen, she was given 2 rounds of epi at the pharmacy.  She was given additional antihistamines and steroids after being observed in the ER yesterday.  She states that symptoms returned earlier this morning around midnight.  No significant improvement noted with 1 dose of Benadryl.  She presents today with continued tingling sensation in her mouth and lips.  On my exam there are no objective findings of angioedema or anaphylaxis.  There is no rash noted.  There is no stridor, wheezing or hypoxia.   Blood pressure within normal limits.  Patient had EpiPen at the bedside but has not used it.  I think majority of her symptoms today could be secondary to anxiety.  Nevertheless since she is experiencing tingling sensation, will give her steroids and continued antihistamines and observe.  She could be experiencing a rebound reaction.  Do not feel an additional dose of epinephrine is indicated at this time unless her symptoms significantly worsen.  Clinical Course as of Feb 19 1111  Fri Feb 19, 2020  0847 On recheck, patient states that she is now having tingling throughout her entire body.  States that "it feels like I have restless leg syndrome."  I again see no objective reaction to these medications, she actually received the same medications when she presented yesterday for an allergic reaction.  Had a discussion with the patient regarding her symptoms.  I have offered her a dose of Ativan to see if this helps calm her reaction down.  Patient states that she is agreeable to a dose of Ativan but "can we make it a really small dose." Informed patient that next step would be to consult hospitalist for possible admission.   [HK]  3818 [id number], patient continues to feel like she is experiencing tingling throughout her body.  Will consult hospitalist for potential admission.  In the meantime we will need to obtain chest x-ray to exclude other cause of her symptoms.   [HK]  Lamy to Dr. Renaee Munda, hospitalist, about this patient. Feels that admission is not indicated at this time as patient is not displaying any objective signs of allergic reaction   [HK]  1108 Spoke to patient again about discharge. Father requesting rx for Ativan for patient. Informed patient that we are unable to do this, but patient states she will take Benadryl as needed.   [HK]    Clinical Course User Index [HK] Delia Heady, PA-C   MDM Rules/Calculators/A&P                      After several hours of observation here in the  ED, patient continues to be well-appearing overall without any signs of angioedema, anaphylaxis or rash.  Suspect that some of the symptoms could be side effects  of the steroids as well.  Since there are no objective findings here, do not feel that she needs to continue taking the prednisone.  She states that she will continue taking the Benadryl.  Of note, her chest x-ray and EKG are unremarkable here.  Patient given strict return precautions.  Patient is hemodynamically stable, in NAD. Evaluation does not show pathology that would require ongoing emergent intervention or inpatient treatment. I have personally reviewed and interpreted all lab work and imaging at today's ED visit. I explained the diagnosis to the patient. Pain has been managed and has no complaints prior to discharge. Patient is comfortable with above plan and is stable for discharge at this time. All questions were answered prior to disposition. Strict return precautions for returning to the ED were discussed. Encouraged follow up with PCP.   An After Visit Summary was printed and given to the patient.   Portions of this note were generated with Scientist, clinical (histocompatibility and immunogenetics). Dictation errors may occur despite best attempts at proofreading.  Final Clinical Impression(s) / ED Diagnoses Final diagnoses:  Allergic reaction, initial encounter    Rx / DC Orders ED Discharge Orders    None       Dietrich Pates, PA-C 02/19/20 1113    Long, Arlyss Repress, MD 02/20/20 [phone]

## 2020-02-19 NOTE — Discharge Instructions (Addendum)
Take Benadryl as needed. Take Benadryl as needed for your symptoms. You do not need to continue the steroids as I believe some of your symptoms could be caused by the steroids. Return to the ER if you start to experience a rash, hives, lip swelling, tongue swelling.

## 2020-02-24 DIAGNOSIS — F419 Anxiety disorder, unspecified: Secondary | ICD-10-CM | POA: Diagnosis not present

## 2020-02-24 DIAGNOSIS — R221 Localized swelling, mass and lump, neck: Secondary | ICD-10-CM | POA: Diagnosis not present

## 2020-03-04 ENCOUNTER — Encounter (HOSPITAL_COMMUNITY): Payer: Self-pay

## 2020-03-04 ENCOUNTER — Emergency Department (HOSPITAL_COMMUNITY): Payer: BC Managed Care – PPO

## 2020-03-04 ENCOUNTER — Other Ambulatory Visit: Payer: Self-pay

## 2020-03-04 ENCOUNTER — Emergency Department (HOSPITAL_COMMUNITY)
Admission: EM | Admit: 2020-03-04 | Discharge: 2020-03-04 | Disposition: A | Discharge status: Home / Self Care | Payer: BC Managed Care – PPO | Attending: Emergency Medicine | Admitting: Emergency Medicine

## 2020-03-04 DIAGNOSIS — Z79899 Other long term (current) drug therapy: Secondary | ICD-10-CM | POA: Insufficient documentation

## 2020-03-04 DIAGNOSIS — R0602 Shortness of breath: Secondary | ICD-10-CM | POA: Diagnosis not present

## 2020-03-04 DIAGNOSIS — J45909 Unspecified asthma, uncomplicated: Secondary | ICD-10-CM | POA: Insufficient documentation

## 2020-03-04 DIAGNOSIS — T7840XA Allergy, unspecified, initial encounter: Secondary | ICD-10-CM | POA: Diagnosis not present

## 2020-03-04 DIAGNOSIS — J392 Other diseases of pharynx: Secondary | ICD-10-CM

## 2020-03-04 DIAGNOSIS — R070 Pain in throat: Secondary | ICD-10-CM | POA: Diagnosis not present

## 2020-03-04 DIAGNOSIS — R42 Dizziness and giddiness: Secondary | ICD-10-CM | POA: Diagnosis not present

## 2020-03-04 DIAGNOSIS — T8052XA Anaphylactic reaction due to vaccination, initial encounter: Secondary | ICD-10-CM | POA: Diagnosis not present

## 2020-03-04 DIAGNOSIS — R457 State of emotional shock and stress, unspecified: Secondary | ICD-10-CM | POA: Diagnosis not present

## 2020-03-04 HISTORY — DX: Anxiety disorder, unspecified: F41.9

## 2020-03-04 LAB — CBC WITH DIFFERENTIAL/PLATELET
Abs Immature Granulocytes: 0.06 10*3/uL (ref 0.00–0.07)
Basophils Absolute: 0 10*3/uL (ref 0.0–0.1)
Basophils Relative: 0 %
Eosinophils Absolute: 0 10*3/uL (ref 0.0–0.5)
Eosinophils Relative: 0 %
HCT: 40.4 % (ref 36.0–46.0)
Hemoglobin: 14.1 g/dL (ref 12.0–15.0)
Immature Granulocytes: 0 %
Lymphocytes Relative: 2 %
Lymphs Abs: 0.4 10*3/uL — ABNORMAL LOW (ref 0.7–4.0)
MCH: 32.3 pg (ref 26.0–34.0)
MCHC: 34.9 g/dL (ref 30.0–36.0)
MCV: 92.7 fL (ref 80.0–100.0)
Monocytes Absolute: 0 10*3/uL — ABNORMAL LOW (ref 0.1–1.0)
Monocytes Relative: 0 %
Neutro Abs: 16.1 10*3/uL — ABNORMAL HIGH (ref 1.7–7.7)
Neutrophils Relative %: 98 %
Platelets: 242 10*3/uL (ref 150–400)
RBC: 4.36 MIL/uL (ref 3.87–5.11)
RDW: 11.9 % (ref 11.5–15.5)
WBC: 16.6 10*3/uL — ABNORMAL HIGH (ref 4.0–10.5)
nRBC: 0 % (ref 0.0–0.2)

## 2020-03-04 LAB — I-STAT CHEM 8, ED
BUN: 12 mg/dL (ref 6–20)
Calcium, Ion: 1.22 mmol/L (ref 1.15–1.40)
Chloride: 106 mmol/L (ref 98–111)
Creatinine, Ser: 0.5 mg/dL (ref 0.44–1.00)
Glucose, Bld: 127 mg/dL — ABNORMAL HIGH (ref 70–99)
HCT: 39 % (ref 36.0–46.0)
Hemoglobin: 13.3 g/dL (ref 12.0–15.0)
Potassium: 3.6 mmol/L (ref 3.5–5.1)
Sodium: 141 mmol/L (ref 135–145)
TCO2: 25 mmol/L (ref 22–32)

## 2020-03-04 LAB — I-STAT BETA HCG BLOOD, ED (MC, WL, AP ONLY): I-stat hCG, quantitative: <5 m[IU]/mL (ref <5)

## 2020-03-04 MED ORDER — SODIUM CHLORIDE 0.9 % IV BOLUS
1000.0000 mL | Freq: Once | INTRAVENOUS | Status: AC
  Administered 2020-03-04: 1000 mL via INTRAVENOUS

## 2020-03-04 MED ORDER — FAMOTIDINE IN NACL 20-0.9 MG/50ML-% IV SOLN
20.0000 mg | Freq: Once | INTRAVENOUS | Status: AC
  Administered 2020-03-04: 20 mg via INTRAVENOUS
  Filled 2020-03-04: qty 50

## 2020-03-04 MED ORDER — EPINEPHRINE 0.3 MG/0.3ML IJ SOAJ: 0.3000 mg | INTRAMUSCULAR | 1 refills | Status: AC | PRN

## 2020-03-04 MED ORDER — SODIUM CHLORIDE 0.9 % IV BOLUS
1000.0000 mL | Freq: Once | INTRAVENOUS | Status: AC
  Administered 2020-03-04: 21:00:00 1000 mL via INTRAVENOUS

## 2020-03-04 MED ORDER — DIPHENHYDRAMINE HCL 25 MG PO TABS: 25.0000 mg | ORAL_TABLET | Freq: Four times a day (QID) | ORAL | 0 refills | Status: DC | PRN

## 2020-03-04 MED ORDER — FAMOTIDINE 20 MG PO TABS: 20.0000 mg | ORAL_TABLET | Freq: Two times a day (BID) | ORAL | 0 refills | Status: DC

## 2020-03-04 MED ORDER — IOHEXOL 300 MG/ML  SOLN
75.0000 mL | Freq: Once | INTRAMUSCULAR | Status: AC | PRN
  Administered 2020-03-04: 75 mL via INTRAVENOUS

## 2020-03-04 MED ORDER — DIPHENHYDRAMINE HCL 50 MG/ML IJ SOLN
25.0000 mg | Freq: Once | INTRAMUSCULAR | Status: AC
  Administered 2020-03-04: 25 mg via INTRAVENOUS
  Filled 2020-03-04: qty 1

## 2020-03-04 NOTE — ED Notes (Signed)
ED Provider at bedside. 

## 2020-03-04 NOTE — ED Provider Notes (Signed)
[facility]-EMERGENCY DEPT Provider Note   CSN: [id number] Arrival date & time: 03/04/20  1514     History Chief Complaint  Patient presents with  . Allergic Reaction    KATALEYA ZAUGG is a 34 y.o. female brought in by EMS from [facility] for evaluation of possible allergic reaction.  Patient reports that this morning she ate breakfast which included a bagel with cream cheese and coffee.  She reports that about 30 minutes later, she felt like she was having shortness of breath and felt like her throat was constricting.  She took a Benadryl and Claritin and called her primary care doctor and got an appointment.  When she got to the doctor's office, they gave her a shot of Solu-Medrol, Benadryl and she felt like that helped symptoms temporarily.  She reports about 10 minutes later, she felt the symptoms were returning and they gave her an EpiPen and called EMS.  Patient reports feeling better after the EpiPen.  Here in the ED, she does feel like some of the tightness when she talks is coming back.  She feels like her tongue and lips are numb but no swelling.  She reports that she has eaten this food beforehand denies any new changes, soaps, lotions, detergents.  No new medications.  She reports having a previous allergic reaction about 2 weeks ago (02/19/2020) that was attributed to her getting the Covid vaccine.  She did not get the second vaccine.  She has not had any rash, vomiting.  The history is provided by the patient.       Past Medical History:  Diagnosis Date  . Anxiety   . Asthma   . Depression     Patient Active Problem List   Diagnosis Date Noted  . DEPRESSION 06/18/2007  . ASTHMA 06/18/2007  . SEIZURE DISORDER 06/18/2007    Past Surgical History:  Procedure Laterality Date  . WISDOM TOOTH EXTRACTION       OB History   No obstetric history on file.     Family History  Problem Relation Age of Onset  . High Cholesterol Mother   .  High Cholesterol Father     Social History   Tobacco Use  . Smoking status: Never Smoker  . Smokeless tobacco: Never Used  Substance Use Topics  . Alcohol use: Yes  . Drug use: No    Home Medications Prior to Admission medications   Medication Sig Start Date End Date Taking? Authorizing Provider  albuterol (PROVENTIL HFA;VENTOLIN HFA) 108 (90 BASE) MCG/ACT inhaler Inhale 2 puffs into the lungs every 6 (six) hours as needed for wheezing.   Yes [provider]  loratadine (CLARITIN) 10 MG tablet Take 10 mg by mouth daily.   Yes [provider]  Misc Natural Products (AIRBORNE ELDERBERRY) CHEW Chew 1 tablet by mouth daily.   Yes [provider]  Multiple Vitamins-Minerals (MULTIVITAMIN ADULT) CHEW Chew 1 tablet by mouth daily.   Yes [provider]  diphenhydrAMINE (BENADRYL) 25 MG tablet Take 1 tablet (25 mg total) by mouth every 6 (six) hours as needed for up to 5 days. 03/04/20 03/09/20  Maxwell Caul, PA-C  EPINEPHrine 0.3 mg/0.3 mL IJ SOAJ injection Inject 0.3 mLs (0.3 mg total) into the muscle as needed for anaphylaxis. 03/04/20   Maxwell Caul, PA-C  famotidine (PEPCID) 20 MG tablet Take 1 tablet (20 mg total) by mouth 2 (two) times daily. 03/04/20   Maxwell Caul, PA-C  ondansetron (ZOFRAN) 8 MG tablet Take 1 tablet (8 mg total) by mouth every 8 (eight) hours as needed for nausea. Patient not taking: Reported on 02/19/2020 11/06/13   Zadie Rhine, MD    Allergies    Amaranth (fd&c red #2), Azithromycin, Pfizer-biontech covid-19 vacc [covid-19 mrna vacc (moderna)], Penicillins, and Naproxen  Review of Systems   Review of Systems  Constitutional: Negative for fever.  HENT: Positive for trouble swallowing. Negative for facial swelling.   Respiratory: Positive for shortness of breath. Negative for cough.   Cardiovascular: Negative for chest pain.  Gastrointestinal: Negative for abdominal pain, nausea and vomiting.  Genitourinary:  Negative for dysuria and hematuria.  Skin: Negative for rash.  Neurological: Negative for headaches.  All other systems reviewed and are negative.   Physical Exam Updated Vital Signs BP 123/78   Pulse 93   Temp 97.7 F (36.5 C) (Oral)   Resp 17   Ht 5\' 3"  (1.6 m)   Wt 66.2 kg   LMP 03/04/2020   SpO2 100%   BMI 25.86 kg/m   Physical Exam Vitals and nursing note reviewed.  Constitutional:      Appearance: Normal appearance. She is well-developed.  HENT:     Head: Normocephalic and atraumatic.     Comments: Face is symmetric in appearance without any overlying warmth, erythema, edema.    Mouth/Throat:     Comments: No oral angioedema.  Airways patent, phonation is intact.  No muffled phonation.  Posterior oropharynx is clear without any signs of erythema, edema. Eyes:     General: Lids are normal.     Conjunctiva/sclera: Conjunctivae normal.     Pupils: Pupils are equal, round, and reactive to light.  Cardiovascular:     Rate and Rhythm: Normal rate and regular rhythm.     Pulses: Normal pulses.     Heart sounds: Normal heart sounds. No murmur. No friction rub. No gallop.   Pulmonary:     Effort: Pulmonary effort is normal.     Breath sounds: Normal breath sounds.     Comments: Lungs clear to auscultation bilaterally.  Symmetric chest rise.  No wheezing, rales, rhonchi. Abdominal:     Palpations: Abdomen is soft. Abdomen is not rigid.     Tenderness: There is no abdominal tenderness. There is no guarding.  Musculoskeletal:        General: Normal range of motion.     Cervical back: Full passive range of motion without pain.  Skin:    General: Skin is warm and dry.     Capillary Refill: Capillary refill takes less than 2 seconds.     Comments: No rash  Neurological:     Mental Status: She is alert and oriented to person, place, and time.  Psychiatric:        Speech: Speech normal.     ED Results / Procedures / Treatments   Labs (all labs ordered are listed, but  only abnormal results are displayed) Labs Reviewed  CBC WITH DIFFERENTIAL/PLATELET - Abnormal; Notable for the following components:      Result Value   WBC 16.6 (*)    Neutro Abs 16.1 (*)    Lymphs Abs 0.4 (*)    Monocytes Absolute 0.0 (*)    All other components within normal limits  I-STAT CHEM 8, ED - Abnormal; Notable for the following components:   Glucose, Bld 127 (*)    All other components within normal limits  I-STAT BETA HCG BLOOD, ED ([facility], [facility], [facility]  ONLY)    EKG None  Radiology CT Soft Tissue Neck W Contrast  Result Date: 03/04/2020 CLINICAL DATA:  Feeling of tightness in throat. EXAM: CT NECK WITH CONTRAST TECHNIQUE: Multidetector CT imaging of the neck was performed using the standard protocol following the bolus administration of intravenous contrast. CONTRAST:  42mL OMNIPAQUE IOHEXOL 300 MG/ML  SOLN COMPARISON:  None. FINDINGS: Pharynx and larynx: Normal. No mass or swelling. Normal larynx and epiglottis. Salivary glands: No inflammation, mass, or stone. Thyroid: Negative Lymph nodes: No enlarged lymph nodes in the neck. Vascular: Normal vascular enhancement. Limited intracranial: Negative Visualized orbits: Negative Mastoids and visualized paranasal sinuses: Negative Skeleton: Negative Upper chest: Negative Other: None IMPRESSION: Negative CT neck. No mass or inflammatory process in the neck. Normal larynx. Electronically Signed   By: Marlan Palau M.D.   On: 03/04/2020 21:32    Procedures Procedures (including critical care time)  Medications Ordered in ED Medications  famotidine (PEPCID) IVPB 20 mg premix (0 mg Intravenous Stopped 03/04/20 1653)  sodium chloride 0.9 % bolus 1,000 mL (0 mLs Intravenous Stopped 03/04/20 1816)  diphenhydrAMINE (BENADRYL) injection 25 mg (25 mg Intravenous Given 03/04/20 1944)  sodium chloride 0.9 % bolus 1,000 mL (1,000 mLs Intravenous New Bag/Given 03/04/20 2121)  iohexol (OMNIPAQUE) 300 MG/ML solution 75 mL (75 mLs Intravenous Contrast  Given 03/04/20 2111)    ED Course  I have reviewed the triage vital signs and the nursing notes.  Pertinent labs & imaging results that were available during my care of the patient were reviewed by me and considered in my medical decision making (see chart for details).    MDM Rules/Calculators/A&P                      34 year old female who presents for evaluation of possible allergic reaction.  Reports that after eating breakfast today, she started developing symptoms.  No new changes in foods, medicines.  Similar reaction about 2 weeks ago was attributed to Covid vaccine.  Saw PCP who gave her Benadryl, Solu-Medrol and EpiPen.  Patient received EpiPen at approximately 2:40 PM.  On initially arrival, she is afebrile, nontoxic-appearing.  Vital signs are stable.  She does feel like some of the throat irritation has returned.  On my exam, she has no evidence of oral angioedema.  She is speaking clear without any signs of muffled phonation.  Plan for Pepcid, observation.  Reevaluation.  Patient is hemodynamically stable.  O2 sats are 100%.  Patient has been able to tolerate water here in the ED.  He does still feel like she has some mild throat closing/irritation.  On my evaluation, she has no subjective signs of oral angioedema.  She is talking without any difficulty and has no muffled phonation, difficulty tolerating her secretions.  We will plan to give her food and continue to observe.  Reevaluation.  Patient reports she feels like her symptoms are coming back.  She feels like she is having some throat closing sensation as well as tingling in her tongue and lips.  She also feels some shortness of breath.  On my evaluation, she has no oral angioedema.  Lungs clear to auscultation without any wheezing, stridor.  She is tolerating secretions and her phonation is intact.  No objective signs of allergic reaction.  She is hemodynamically stable with O2 sats 100%.  Plan to give additional dose of Benadryl  and monitor her.  Reevaluation after Benadryl.  Patient does not feel like her symptoms are getting  worse but she states she feels still lightheaded and still feels like she is having spasming and constricting of her throat.  She has not had any vomiting here in the ED.  She is hemodynamically stable.  We discussed continued treatment here in the ED and further interventions such as a CT scan of her neck for evaluation of any swelling or edema.  We engaged in shared decision making discussed risk first benefits.  After discussion, patient would like to proceed with CT scan of her neck for evaluation.  CBC shows leukocytosis of 16.6.  She did have steroids and this may account for it.  I-STAT Chem-8 shows normal BUN and creatinine.  CT soft tissue neck shows no evidence of mass or inflammatory process.  Reevaluation.  Patient is resting comfortably on bed.  She is hemodynamically stable.  At this time, given lack of objective findings, hemodynamic stability, lack of any inflammatory process on CT scan, do not feel that admission is warranted. Discussed patient with Dr. Maryan Rued who is agreeable to plan.  I did discuss with patient that this could be esophageal spasm.  He does feel like when she was here in the ED, she had a evidence of spasm and had some burping.  Encouraged her to take Pepcid as directed. At this time, patient exhibits no emergent life-threatening condition that require further evaluation in ED. Patient had ample opportunity for questions and discussion. All patient's questions were answered with full understanding. Strict return precautions discussed. Patient expresses understanding and agreement to plan.   Portions of this note were generated with Lobbyist. Dictation errors may occur despite best attempts at proofreading.  Final Clinical Impression(s) / ED Diagnoses Final diagnoses:  Allergic reaction, initial encounter  Throat irritation    Rx / DC Orders ED  Discharge Orders         Ordered    diphenhydrAMINE (BENADRYL) 25 MG tablet  Every 6 hours PRN     03/04/20 2214    famotidine (PEPCID) 20 MG tablet  2 times daily     03/04/20 2214    EPINEPHrine 0.3 mg/0.3 mL IJ SOAJ injection  As needed     03/04/20 2214           Desma Mcgregor 03/04/20 2218    Blanchie Dessert, MD 03/04/20 2228

## 2020-03-04 NOTE — ED Triage Notes (Signed)
Per EMS- Patient came from [facility].  Patient c/o tightness in her throat. Patient took Claritin and Benadryl prior to going to her PCP.  Patient was given epi 0.3, solumedrol 125 mg IM and Benadryl 50 mg IM prior to EMS arrival. Patient states she had a similar episode 2 weeks ago after receiving a covid vaccine.

## 2020-03-04 NOTE — ED Notes (Signed)
Patient given apple sauce

## 2020-03-04 NOTE — Discharge Instructions (Signed)
Take Benadryl as directed.   Take Pepcid as directed.   I have provided you a refill prescription for EpiPen to use as needed.  Follow-up with your primary care doctor.  Return the emergency department for any difficulty breathing, chest pain, vomiting, inability to swallow your secretions/saliva or liquids, any other worsening or concerning symptoms.

## 2020-03-04 NOTE — ED Notes (Signed)
Patient oxygen saturation remained above 93% on room air while ambulating in room.

## 2020-03-04 NOTE — ED Notes (Signed)
Family at bedside. 

## 2020-03-04 NOTE — ED Notes (Signed)
Patient given water

## 2020-03-09 ENCOUNTER — Encounter (HOSPITAL_COMMUNITY): Payer: Self-pay

## 2020-03-09 ENCOUNTER — Other Ambulatory Visit: Payer: Self-pay

## 2020-03-09 ENCOUNTER — Emergency Department (HOSPITAL_COMMUNITY)
Admission: EM | Admit: 2020-03-09 | Discharge: 2020-03-10 | Disposition: A | Discharge status: Home / Self Care | Payer: BC Managed Care – PPO | Attending: Emergency Medicine | Admitting: Emergency Medicine

## 2020-03-09 DIAGNOSIS — T7840XA Allergy, unspecified, initial encounter: Secondary | ICD-10-CM | POA: Insufficient documentation

## 2020-03-09 DIAGNOSIS — Z79899 Other long term (current) drug therapy: Secondary | ICD-10-CM | POA: Diagnosis not present

## 2020-03-09 DIAGNOSIS — R0602 Shortness of breath: Secondary | ICD-10-CM | POA: Diagnosis not present

## 2020-03-09 DIAGNOSIS — J45909 Unspecified asthma, uncomplicated: Secondary | ICD-10-CM | POA: Diagnosis not present

## 2020-03-09 DIAGNOSIS — R0789 Other chest pain: Secondary | ICD-10-CM | POA: Diagnosis not present

## 2020-03-09 DIAGNOSIS — R0600 Dyspnea, unspecified: Secondary | ICD-10-CM | POA: Diagnosis not present

## 2020-03-09 MED ORDER — DIPHENHYDRAMINE HCL 50 MG/ML IJ SOLN
25.0000 mg | Freq: Once | INTRAMUSCULAR | Status: AC
  Administered 2020-03-09: 25 mg via INTRAVENOUS
  Filled 2020-03-09: qty 1

## 2020-03-09 MED ORDER — SODIUM CHLORIDE 0.9 % IV BOLUS
1000.0000 mL | Freq: Once | INTRAVENOUS | Status: AC
  Administered 2020-03-09: 1000 mL via INTRAVENOUS

## 2020-03-09 MED ORDER — EPINEPHRINE 0.3 MG/0.3ML IJ SOAJ
0.3000 mg | Freq: Once | INTRAMUSCULAR | Status: AC
  Administered 2020-03-09: 0.3 mg via INTRAMUSCULAR
  Filled 2020-03-09: qty 0.3

## 2020-03-09 MED ORDER — METHYLPREDNISOLONE SODIUM SUCC 125 MG IJ SOLR
125.0000 mg | Freq: Once | INTRAMUSCULAR | Status: AC
  Administered 2020-03-10: 01:00:00 125 mg via INTRAVENOUS
  Filled 2020-03-09: qty 2

## 2020-03-09 MED ORDER — FAMOTIDINE IN NACL 20-0.9 MG/50ML-% IV SOLN
20.0000 mg | Freq: Once | INTRAVENOUS | Status: AC
  Administered 2020-03-09: 20 mg via INTRAVENOUS
  Filled 2020-03-09: qty 50

## 2020-03-09 NOTE — ED Triage Notes (Signed)
Similar encounter to 03/04/20.   Sts shob and throat tightness. Took  50mg  benadryl, 10mg  Pepcid, and 2 puffs of albuterol inhaler pta with no  Improvement.

## 2020-03-09 NOTE — ED Provider Notes (Signed)
[facility]EM[facility]GENCY DEPT Provider Note   CSN: [id number] Arrival date & time: 03/09/20  2107     History Chief Complaint  Patient presents with   Shortness of Breath    Leslie Thompson is a 34 y.o. female.  The history is provided by the patient and medical records. No language interpreter was used.  Allergic Reaction Presenting symptoms: difficulty breathing, difficulty swallowing and swelling (sensation)   Presenting symptoms: no rash and no wheezing   Severity:  Severe Prior allergic episodes:  Allergies to medications Relieved by:  Nothing Worsened by:  Nothing Ineffective treatments:  Antihistamines      Past Medical History:  Diagnosis Date   Anxiety    Asthma    Depression     Patient Active Problem List   Diagnosis Date Noted   DEPRESSION 06/18/2007   ASTHMA 06/18/2007   SEIZURE DISORDER 06/18/2007    Past Surgical History:  Procedure Laterality Date   WISDOM TOOTH EXTRACTION       OB History   No obstetric history on file.     Family History  Problem Relation Age of Onset   High Cholesterol Mother    High Cholesterol Father     Social History   Tobacco Use   Smoking status: Never Smoker   Smokeless tobacco: Never Used  Substance Use Topics   Alcohol use: Yes   Drug use: No    Home Medications Prior to Admission medications   Medication Sig Start Date End Date Taking? Authorizing Provider  albuterol (PROVENTIL HFA;VENTOLIN HFA) 108 (90 BASE) MCG/ACT inhaler Inhale 2 puffs into the lungs every 6 (six) hours as needed for wheezing.   Yes [provider]  diphenhydrAMINE (BENADRYL) 25 MG tablet Take 1 tablet (25 mg total) by mouth every 6 (six) hours as needed for up to 5 days. 03/04/20 03/09/20 Yes Maxwell Caul, PA-C  EPINEPHrine 0.3 mg/0.3 mL IJ SOAJ injection Inject 0.3 mLs (0.3 mg total) into the muscle as needed for anaphylaxis. 03/04/20  Yes Maxwell Caul, PA-C  famotidine (PEPCID) 20  MG tablet Take 1 tablet (20 mg total) by mouth 2 (two) times daily. 03/04/20  Yes Maxwell Caul, PA-C  loratadine (CLARITIN) 10 MG tablet Take 10 mg by mouth daily.   Yes [provider]  Misc Natural Products (AIRBORNE ELDERBERRY) CHEW Chew 1 tablet by mouth daily.   Yes [provider]  Multiple Vitamins-Minerals (MULTIVITAMIN ADULT) CHEW Chew 1 tablet by mouth daily.   Yes [provider]  ondansetron (ZOFRAN) 8 MG tablet Take 1 tablet (8 mg total) by mouth every 8 (eight) hours as needed for nausea. Patient not taking: Reported on 02/19/2020 11/06/13   Zadie Rhine, MD    Allergies    Amaranth (fd&c red #2), Azithromycin, Pfizer-biontech covid-19 vacc [covid-19 mrna vacc (moderna)], Penicillins, and Naproxen  Review of Systems   Review of Systems  Constitutional: Negative for chills, fatigue and fever.  HENT: Positive for trouble swallowing. Negative for congestion.   Respiratory: Positive for chest tightness. Negative for cough, shortness of breath and wheezing.   Cardiovascular: Negative for chest pain, palpitations and leg swelling.  Gastrointestinal: Positive for nausea. Negative for abdominal pain, constipation, diarrhea and vomiting.  Genitourinary: Negative for flank pain.  Musculoskeletal: Negative for back pain.  Skin: Negative for rash.  Neurological: Negative for dizziness, weakness, light-headedness and headaches.  Psychiatric/Behavioral: Negative for agitation and confusion.  All other systems reviewed and are negative.   Physical Exam  Updated Vital Signs BP (!) 140/92 (BP Location: Left Arm)    Pulse 77    Temp 98.4 F (36.9 C) (Oral)    Resp (!) 22    Ht 5\' 3"  (1.6 m)    Wt 65.8 kg    LMP 03/04/2020    SpO2 97%    BMI 25.69 kg/m   Physical Exam Vitals and nursing note reviewed.  Constitutional:      General: She is not in acute distress.    Appearance: She is well-developed. She is not ill-appearing, toxic-appearing or diaphoretic.   HENT:     Head: Normocephalic and atraumatic.  Eyes:     Extraocular Movements: Extraocular movements intact.     Conjunctiva/sclera: Conjunctivae normal.     Pupils: Pupils are equal, round, and reactive to light.  Cardiovascular:     Rate and Rhythm: Normal rate and regular rhythm.     Heart sounds: No murmur.  Pulmonary:     Effort: Pulmonary effort is normal. No respiratory distress.     Breath sounds: Normal breath sounds. No decreased breath sounds, wheezing, rhonchi or rales.  Chest:     Chest wall: No tenderness.  Abdominal:     Palpations: Abdomen is soft.     Tenderness: There is no abdominal tenderness.  Musculoskeletal:     Cervical back: Normal range of motion and neck supple.     Right lower leg: No tenderness. No edema.     Left lower leg: No tenderness. No edema.  Skin:    General: Skin is warm and dry.     Capillary Refill: Capillary refill takes less than 2 seconds.     Findings: No erythema or rash.  Neurological:     General: No focal deficit present.     Mental Status: She is alert and oriented to person, place, and time.     Cranial Nerves: No cranial nerve deficit.  Psychiatric:        Mood and Affect: Mood normal.     ED Results / Procedures / Treatments   Labs (all labs ordered are listed, but only abnormal results are displayed) Labs Reviewed  I-STAT BETA HCG BLOOD, ED ([facility], [facility], [facility] ONLY)    EKG None  Radiology No results found.  Procedures Procedures (including critical care time)  CRITICAL CARE Performed by: Gwenyth Allegra Arieanna Pressey Total critical care time: 35 minutes Critical care time was exclusive of separately billable procedures and treating other patients. Critical care was necessary to treat or prevent imminent or life-threatening deterioration. Critical care was time spent personally by me on the following activities: development of treatment plan with patient and/or surrogate as well as nursing, discussions with consultants,  evaluation of patient's response to treatment, examination of patient, obtaining history from patient or surrogate, ordering and performing treatments and interventions, ordering and review of laboratory studies, ordering and review of radiographic studies, pulse oximetry and re-evaluation of patient's condition.   Medications Ordered in ED Medications  diphenhydrAMINE (BENADRYL) injection 25 mg (25 mg Intravenous Given 03/09/20 2346)  EPINEPHrine (EPI-PEN) injection 0.3 mg (0.3 mg Intramuscular Given 03/09/20 2341)  methylPREDNISolone sodium succinate (SOLU-MEDROL) 125 mg/2 mL injection 125 mg (125 mg Intravenous Given 03/10/20 0051)  famotidine (PEPCID) IVPB 20 mg premix (0 mg Intravenous Stopped 03/10/20 0051)  sodium chloride 0.9 % bolus 1,000 mL (0 mLs Intravenous Stopped 03/10/20 0052)    ED Course  I have reviewed the triage vital signs and the nursing notes.  Pertinent labs & imaging  results that were available during my care of the patient were reviewed by me and considered in my medical decision making (see chart for details).    MDM Rules/Calculators/A&P                      HUYEN PERAZZO is a 34 y.o. female with a past medical history sniffing for asthma, seizure disorder, depression, anxiety, and recent allergic reactions who presents with worsening allergic reaction.  Patient reports that the series of allergic reactions all began 3 weeks ago when she had a Covid vaccine when she had allergic reaction requiring epinephrine.  She reports that she has had several visits again with recurrent allergic reaction symptoms.  She reports that today after walk, she started having worsening breathing, throat tightness, mouth tingling, lightheaded, and burping/nausea.  She reports this is similar to recent visits.  She says that she has been taking the antihistamines but the steroids caused her to feel bad.  She has her epinephrine pen was not needed to use it yet.  She reports that she is feeling that  her throat is tight and she is having difficulty swallowing due to the tightening, nausea, and burping feeling.  She denies any actual emesis, rash, or chest pain.  On exam, patient does not have stridor and I did not see swelling in the mouth or face.  Lungs had no wheezing and I did not see rash.  Chest and abdomen are nontender.  Patient is resting comfortably but is clearly concerned and upset about what seems to be allergic reaction.  Had a long discussion with the patient about her management.  She is very concerned that she feels her throat is tightening and closing and is having the tickling and swelling sensation in her mouth.  She also feels she is short of breath and this is not improving with the home medications of antihistamines.  Given the reportedly worsening symptoms, we will give the patient epinephrine as well as antihistamines and steroids.  We will monitor her for the next few hours to see if symptoms improve.  If she is feeling better, dissipate discharge to follow-up with the allergist she is scheduled to [facility] at [facility] soon.   Care will be transferred to oncoming night team for evaluation and monitoring for allergic reaction management.  Anticipate discharge if she is feeling better in several hours.         Final Clinical Impression(s) / ED Diagnoses Final diagnoses:  Allergic reaction, initial encounter     Clinical Impression: 1. Allergic reaction, initial encounter     Disposition: Care transferred to oncoming team while awaiting reassessment after time passes after epinephrine antihistamines and steroids.  Anticipate discharge if allergic reaction is improved.  This note was prepared with assistance of Conservation officer, historic buildings. Occasional wrong-word or sound-a-like substitutions may have occurred due to the inherent limitations of voice recognition software.      Leslie Thompson, Leslie Brim, MD 03/10/20 [phone]

## 2020-03-10 LAB — I-STAT BETA HCG BLOOD, ED (MC, WL, AP ONLY): I-stat hCG, quantitative: <5 m[IU]/mL (ref <5)

## 2020-03-10 NOTE — ED Notes (Signed)
Verbal order from EDP to PO challenge.  Pt tolerated without incident. Dr.Palumbo made aware.

## 2020-03-11 ENCOUNTER — Ambulatory Visit (HOSPITAL_COMMUNITY)
Admission: EM | Admit: 2020-03-11 | Discharge: 2020-03-11 | Disposition: A | Discharge status: Home / Self Care | Payer: BC Managed Care – PPO | Attending: Emergency Medicine | Admitting: Emergency Medicine

## 2020-03-11 ENCOUNTER — Encounter (HOSPITAL_COMMUNITY): Payer: Self-pay

## 2020-03-11 ENCOUNTER — Other Ambulatory Visit: Payer: Self-pay

## 2020-03-11 DIAGNOSIS — T7840XD Allergy, unspecified, subsequent encounter: Secondary | ICD-10-CM

## 2020-03-11 DIAGNOSIS — R1310 Dysphagia, unspecified: Secondary | ICD-10-CM

## 2020-03-11 MED ORDER — HYDROXYZINE HCL 25 MG PO TABS
25.0000 mg | ORAL_TABLET | Freq: Four times a day (QID) | ORAL | 0 refills | Status: DC | PRN
Start: 2020-03-11 — End: 2024-05-20

## 2020-03-11 MED ORDER — PANTOPRAZOLE SODIUM 20 MG PO TBEC: 20.0000 mg | DELAYED_RELEASE_TABLET | Freq: Every day | ORAL | 0 refills | Status: AC

## 2020-03-11 MED ORDER — FAMOTIDINE 20 MG PO TABS
20.0000 mg | ORAL_TABLET | Freq: Two times a day (BID) | ORAL | 0 refills | Status: AC
Start: 2020-03-11 — End: ?

## 2020-03-11 MED ORDER — METHYLPREDNISOLONE 4 MG PO TBPK
ORAL_TABLET | Freq: Every day | ORAL | 0 refills | Status: DC
Start: 2020-03-11 — End: 2021-06-03

## 2020-03-11 NOTE — ED Provider Notes (Signed)
HPI  SUBJECTIVE:  Leslie Thompson is a 34 y.o. female who presents with dysphagia described as "my throat tightening up" when she eats.  She states that it occurs with all foods, including soft, pured foods.  She has been taking 25 mg of Benadryl and 20 mg of Pepcid twice daily with improvement in her symptoms.  Symptoms are also better with fasting.  Symptoms are worse with eating any food although she is able to drink water without her throat tightening up.  She reports mild lip/tongue tingling.  No angioedema.  She reports shortness of breath with talking for prolonged periods of time, no other shortness of breath.  No wheezing, urticaria, rash, erythema, drooling, trismus, vomiting, diarrhea, abdominal pain, syncope.  No regurgitation.  Patient has been seen in the ER 4 times since 4/15 for allergic reaction that was thought to be due to Covid vaccine.  On the first visit, given epi x2, Benadryl, Solu-Medrol Pepcid IV fluids first visit, sent home with steroids Pepcid Benadryl EpiPen.  Presented next day for the same symptoms, was given steroids, antihistamines, and some Ativan.  Returned to the ED on 4/30 for new allergic reaction got epi, Solu-Medrol, Benadryl prior to arrival to the ED.  She had a CT of the neck which was negative for any inflammatory process.  Last visit to the ED was 2 days ago where she was given epi, Solu-Medrol, Pepcid and Benadryl.  States that she took 1 day of the prednisone that was prescribed to her 6 on her first visit and discontinued it because of the side effects.  She has not been taking any other steroids then which she has been getting in the ED.  States that she has tolerated methylprednisolone without any problem.  She has a past medical letter of allergies, anaphylaxis to penicillin and azithromycin.  No history of diabetes, hypertension, GERD.  LMP: Denies possibility of being pregnant.  PMD: PCP.  She states that she has follow-up with [facility] in [place]  on Wednesday.  Past Medical History:  Diagnosis Date  . Anxiety   . Asthma   . Depression     Past Surgical History:  Procedure Laterality Date  . WISDOM TOOTH EXTRACTION      Family History  Problem Relation Age of Onset  . High Cholesterol Mother   . High Cholesterol Father     Social History   Tobacco Use  . Smoking status: Never Smoker  . Smokeless tobacco: Never Used  Substance Use Topics  . Alcohol use: Yes  . Drug use: No    No current facility-administered medications for this encounter.  Current Outpatient Medications:  .  albuterol (PROVENTIL HFA;VENTOLIN HFA) 108 (90 BASE) MCG/ACT inhaler, Inhale 2 puffs into the lungs every 6 (six) hours as needed for wheezing., Disp: , Rfl:  .  EPINEPHrine 0.3 mg/0.3 mL IJ SOAJ injection, Inject 0.3 mLs (0.3 mg total) into the muscle as needed for anaphylaxis., Disp: 1 each, Rfl: 1 .  famotidine (PEPCID) 20 MG tablet, Take 1 tablet (20 mg total) by mouth 2 (two) times daily., Disp: 40 tablet, Rfl: 0 .  hydrOXYzine (ATARAX/VISTARIL) 25 MG tablet, Take 1 tablet (25 mg total) by mouth every 6 (six) hours as needed for itching., Disp: 20 tablet, Rfl: 0 .  loratadine (CLARITIN) 10 MG tablet, Take 10 mg by mouth daily., Disp: , Rfl:  .  methylPREDNISolone (MEDROL DOSEPAK) 4 MG TBPK tablet, Take by mouth daily. Follow package instructions, Disp: 21 tablet,  Rfl: 0 .  pantoprazole (PROTONIX) 20 MG tablet, Take 1 tablet (20 mg total) by mouth daily., Disp: 30 tablet, Rfl: 0  Allergies  Allergen Reactions  . Amaranth (Fd&C Red #2) Anaphylaxis  . Azithromycin Shortness Of Breath  . Pfizer-Biontech Covid-19 Vacc [Covid-19 Mrna Vacc (Moderna)] Anaphylaxis  . Penicillins Hives    Did it involve swelling of the face/tongue/throat, SOB, or low BP? Y Did it involve sudden or severe rash/hives, skin peeling, or any reaction on the inside of your mouth or nose? Y Did you need to seek medical attention at a hospital or doctor's office?  N When did it last happen? childhood If all above answers are "NO", may proceed with cephalosporin use.   . Naproxen     Other reaction(s): Abdominal Pain     ROS  As noted in HPI.   Physical Exam  BP 128/78 (BP Location: Left Arm)   Pulse 88   Temp 98.4 F (36.9 C) (Oral)   Resp 16   LMP 03/04/2020   SpO2 100%   Constitutional: Well developed, well nourished, no acute distress Eyes:  EOMI, conjunctiva normal bilaterally HENT: Normocephalic, atraumatic,mucus membranes moist no angioedema of the lips or tongue.  Airway widely patent.  No drooling, stridor.  Normal voice. Respiratory: Normal inspiratory effort lungs clear bilaterally good air movement Cardiovascular: Normal rate regular rhythm no murmurs rubs or gallops  GI: nondistended skin: No rash, skin intact Musculoskeletal: no deformities Neurologic: Alert & oriented x 3, no focal neuro deficits Psychiatric: Speech and behavior appropriate   ED Course   Medications - No data to display  No orders of the defined types were placed in this encounter.   No results found for this or any previous visit (from the past 24 hour(s)). No results found.  ED Clinical Impression  1. Dysphagia, unspecified type   2. Allergic reaction, subsequent encounter      ED Assessment/Plan  ER records reviewed.  As noted in HPI.  Patient reporting dysphagia.  There is no evidence of airway compromise, anaphylaxis here.  No drooling, stridor.  Suspect that the symptoms could be coming from partially controlled ongoing allergic reaction.  She has only been getting steroids in the ED.  She states that she tolerated the methylprednisolone without any problem.  She has follow-up with [facility] in [place] on Wednesday.  Will send home with a Medrol Dosepak, continue Pepcid 20 mg p.o. twice daily, will start on some Protonix.  She is to try Claritin or Zyrtec during the day, 50 mg of Benadryl at night, if this does not work,  we will send her home with some Atarax.  She has follow-up already arranged with an allergist next week.  Do not think that she needs to see GI at this point time.  Gave her strict ER return precautions.  Discussed  MDM, treatment plan, and plan for follow-up with patient. Discussed sn/sx that should prompt return to the ED. patient agrees with plan.   Meds ordered this encounter  Medications  . famotidine (PEPCID) 20 MG tablet    Sig: Take 1 tablet (20 mg total) by mouth 2 (two) times daily.    Dispense:  40 tablet    Refill:  0  . pantoprazole (PROTONIX) 20 MG tablet    Sig: Take 1 tablet (20 mg total) by mouth daily.    Dispense:  30 tablet    Refill:  0  . methylPREDNISolone (MEDROL DOSEPAK) 4 MG TBPK tablet  Sig: Take by mouth daily. Follow package instructions    Dispense:  21 tablet    Refill:  0  . hydrOXYzine (ATARAX/VISTARIL) 25 MG tablet    Sig: Take 1 tablet (25 mg total) by mouth every 6 (six) hours as needed for itching.    Dispense:  20 tablet    Refill:  0    *This clinic note was created using [employer]. Therefore, there may be occasional mistakes despite careful proofreading.   ?   Melynda Ripple, MD 03/13/20 1055

## 2020-03-11 NOTE — Discharge Instructions (Addendum)
Medrol Dosepak, continue Pepcid 20 mg p.o. twice daily, start Protonix.  try Claritin or Zyrtec during the day, 50 mg of Benadryl at night, if this does not work, and take the Atarax/hydroxyzine.  May take 50 mg of this at night.

## 2020-03-11 NOTE — ED Triage Notes (Signed)
Pt states she had significant allergic reaction approx 3 weeks after receiving the 2nd dose of COVID vaccine. Has had multiple allergic reactions since then; and reports difficulty breathing/compromised airway yesterday and took benadryl. States airway compromise increases after eating/drinking and improves somewhat in between meals/drinks.  Able to speak full sentences w/o difficulty at present.  Last dose of pepcid 10mg  at 1400; 25mg  Benadryl at 1315 today. Has had two doses of benadryl and pepcid since this morning to ease allergic reaction symptoms.  Also reports some upper/right chest pressure 3/10.

## 2020-03-15 DIAGNOSIS — J4520 Mild intermittent asthma, uncomplicated: Secondary | ICD-10-CM | POA: Diagnosis not present

## 2020-03-15 DIAGNOSIS — Z880 Allergy status to penicillin: Secondary | ICD-10-CM | POA: Diagnosis not present

## 2020-03-15 DIAGNOSIS — J383 Other diseases of vocal cords: Secondary | ICD-10-CM | POA: Diagnosis not present

## 2020-03-15 DIAGNOSIS — J310 Chronic rhinitis: Secondary | ICD-10-CM | POA: Diagnosis not present

## 2020-03-15 DIAGNOSIS — T782XXA Anaphylactic shock, unspecified, initial encounter: Secondary | ICD-10-CM | POA: Diagnosis not present

## 2020-03-15 DIAGNOSIS — R070 Pain in throat: Secondary | ICD-10-CM | POA: Diagnosis not present

## 2020-03-16 ENCOUNTER — Ambulatory Visit: Payer: 59 | Admitting: Allergy & Immunology

## 2020-04-01 DIAGNOSIS — J343 Hypertrophy of nasal turbinates: Secondary | ICD-10-CM | POA: Diagnosis not present

## 2020-04-01 DIAGNOSIS — R0989 Other specified symptoms and signs involving the circulatory and respiratory systems: Secondary | ICD-10-CM | POA: Diagnosis not present

## 2020-04-18 DIAGNOSIS — J301 Allergic rhinitis due to pollen: Secondary | ICD-10-CM | POA: Diagnosis not present

## 2020-04-18 DIAGNOSIS — J300 Vasomotor rhinitis: Secondary | ICD-10-CM | POA: Diagnosis not present

## 2020-04-18 DIAGNOSIS — R142 Eructation: Secondary | ICD-10-CM | POA: Diagnosis not present

## 2020-04-18 DIAGNOSIS — J383 Other diseases of vocal cords: Secondary | ICD-10-CM | POA: Diagnosis not present

## 2020-05-17 DIAGNOSIS — R1314 Dysphagia, pharyngoesophageal phase: Secondary | ICD-10-CM | POA: Diagnosis not present

## 2020-05-30 DIAGNOSIS — Z0000 Encounter for general adult medical examination without abnormal findings: Secondary | ICD-10-CM | POA: Diagnosis not present

## 2020-05-30 DIAGNOSIS — Z13220 Encounter for screening for lipoid disorders: Secondary | ICD-10-CM | POA: Diagnosis not present

## 2020-06-01 DIAGNOSIS — R142 Eructation: Secondary | ICD-10-CM | POA: Diagnosis not present

## 2020-06-01 DIAGNOSIS — K2930 Chronic superficial gastritis without bleeding: Secondary | ICD-10-CM | POA: Diagnosis not present

## 2020-06-01 DIAGNOSIS — K228 Other specified diseases of esophagus: Secondary | ICD-10-CM | POA: Diagnosis not present

## 2020-06-01 DIAGNOSIS — R1310 Dysphagia, unspecified: Secondary | ICD-10-CM | POA: Diagnosis not present

## 2020-06-01 DIAGNOSIS — R140 Abdominal distension (gaseous): Secondary | ICD-10-CM | POA: Diagnosis not present

## 2020-06-02 DIAGNOSIS — H5213 Myopia, bilateral: Secondary | ICD-10-CM | POA: Diagnosis not present

## 2020-06-02 DIAGNOSIS — H5210 Myopia, unspecified eye: Secondary | ICD-10-CM | POA: Diagnosis not present

## 2020-07-05 ENCOUNTER — Other Ambulatory Visit (HOSPITAL_COMMUNITY): Payer: Self-pay

## 2020-07-05 DIAGNOSIS — R1310 Dysphagia, unspecified: Secondary | ICD-10-CM

## 2020-07-22 ENCOUNTER — Ambulatory Visit (HOSPITAL_COMMUNITY)
Admission: RE | Admit: 2020-07-22 | Discharge: 2020-07-22 | Disposition: A | Discharge status: Home / Self Care | Payer: BC Managed Care – PPO | Source: Ambulatory Visit | Attending: Gastroenterology | Admitting: Gastroenterology

## 2020-07-22 ENCOUNTER — Other Ambulatory Visit: Payer: Self-pay

## 2020-07-22 DIAGNOSIS — R1310 Dysphagia, unspecified: Secondary | ICD-10-CM | POA: Insufficient documentation

## 2020-07-22 NOTE — Progress Notes (Signed)
Modified Barium Swallow Progress Note  Patient Details  Name: Leslie Thompson MRN: [id number] Date of Birth: [birth year]  Today's Date: 07/22/2020  Modified Barium Swallow completed.  Full report located under Chart Review in the Imaging Section.  Brief recommendations include the following:  Clinical Impression  Pt was seen in radiology suite for modified barium swallow study. Trials of puree solids, regular texture solids, a 32mm barium tablet, and thin liquids via cup and straw were administered. Pt's oropharyngeal swallow mechanism was within normal limits and no overt signs of dysfunction were noted during esophageal screening. It is recommended that a regular texture diet with thin liquids be continued at this time. Pt was educated regarding the results of the study and recommendations. Video recording of the study was used to facilitate education and the pt verbalized understanding. Pt endorsed having some anxiety with regards to eating due to the episode that she had after receipt of the COVID vaccine and appeared relieved by the SLP's assurance that her swallow mechanism was within normal limits. Pt agreed that she believes her anxiety with regards to swallowing may be a contributing factor and indicated that she is currently awaiting insurance approval for evaluation by a neuropsychologist/neuropsychiatrist to assist with this. Further skilled SLP services are not clinically indicated at this time.    Swallow Evaluation Recommendations       SLP Diet Recommendations: Regular solids;Thin liquid   Liquid Administration via: Cup;Straw   Medication Administration: Whole meds with liquid   Supervision: Patient able to self feed       Postural Changes: Remain semi-upright after after feeds/meals (Comment)   Oral Care Recommendations: Oral care BID       Yuval Rubens I. Vear Clock, MS, CCC-SLP Acute Rehabilitation Services Office number [phone] Pager [phone]  Scheryl Marten 07/22/2020,4:26 PM

## 2020-08-12 DIAGNOSIS — F064 Anxiety disorder due to known physiological condition: Secondary | ICD-10-CM | POA: Diagnosis not present

## 2020-09-02 DIAGNOSIS — F4323 Adjustment disorder with mixed anxiety and depressed mood: Secondary | ICD-10-CM | POA: Diagnosis not present

## 2020-09-16 DIAGNOSIS — F4323 Adjustment disorder with mixed anxiety and depressed mood: Secondary | ICD-10-CM | POA: Diagnosis not present

## 2020-09-22 DIAGNOSIS — J383 Other diseases of vocal cords: Secondary | ICD-10-CM | POA: Diagnosis not present

## 2020-09-22 DIAGNOSIS — Z7185 Encounter for immunization safety counseling: Secondary | ICD-10-CM | POA: Diagnosis not present

## 2020-09-22 DIAGNOSIS — F419 Anxiety disorder, unspecified: Secondary | ICD-10-CM | POA: Diagnosis not present

## 2020-09-22 DIAGNOSIS — T50B95A Adverse effect of other viral vaccines, initial encounter: Secondary | ICD-10-CM | POA: Diagnosis not present

## 2020-10-14 DIAGNOSIS — F4323 Adjustment disorder with mixed anxiety and depressed mood: Secondary | ICD-10-CM | POA: Diagnosis not present

## 2020-10-26 DIAGNOSIS — K30 Functional dyspepsia: Secondary | ICD-10-CM | POA: Diagnosis not present

## 2020-10-26 DIAGNOSIS — K219 Gastro-esophageal reflux disease without esophagitis: Secondary | ICD-10-CM | POA: Diagnosis not present

## 2021-03-27 IMAGING — DX DG CHEST 1V PORT
1 series · 1 of 1 positions shown · non-contrast
Comparison: 03/04/2011

CLINICAL DATA: COVID vaccine yesterday with reaction including
throat tightening and dizziness as well as weakness. Patient states
return of symptoms last night and this morning.

EXAM:
PORTABLE CHEST 1 VIEW

[chest ap]
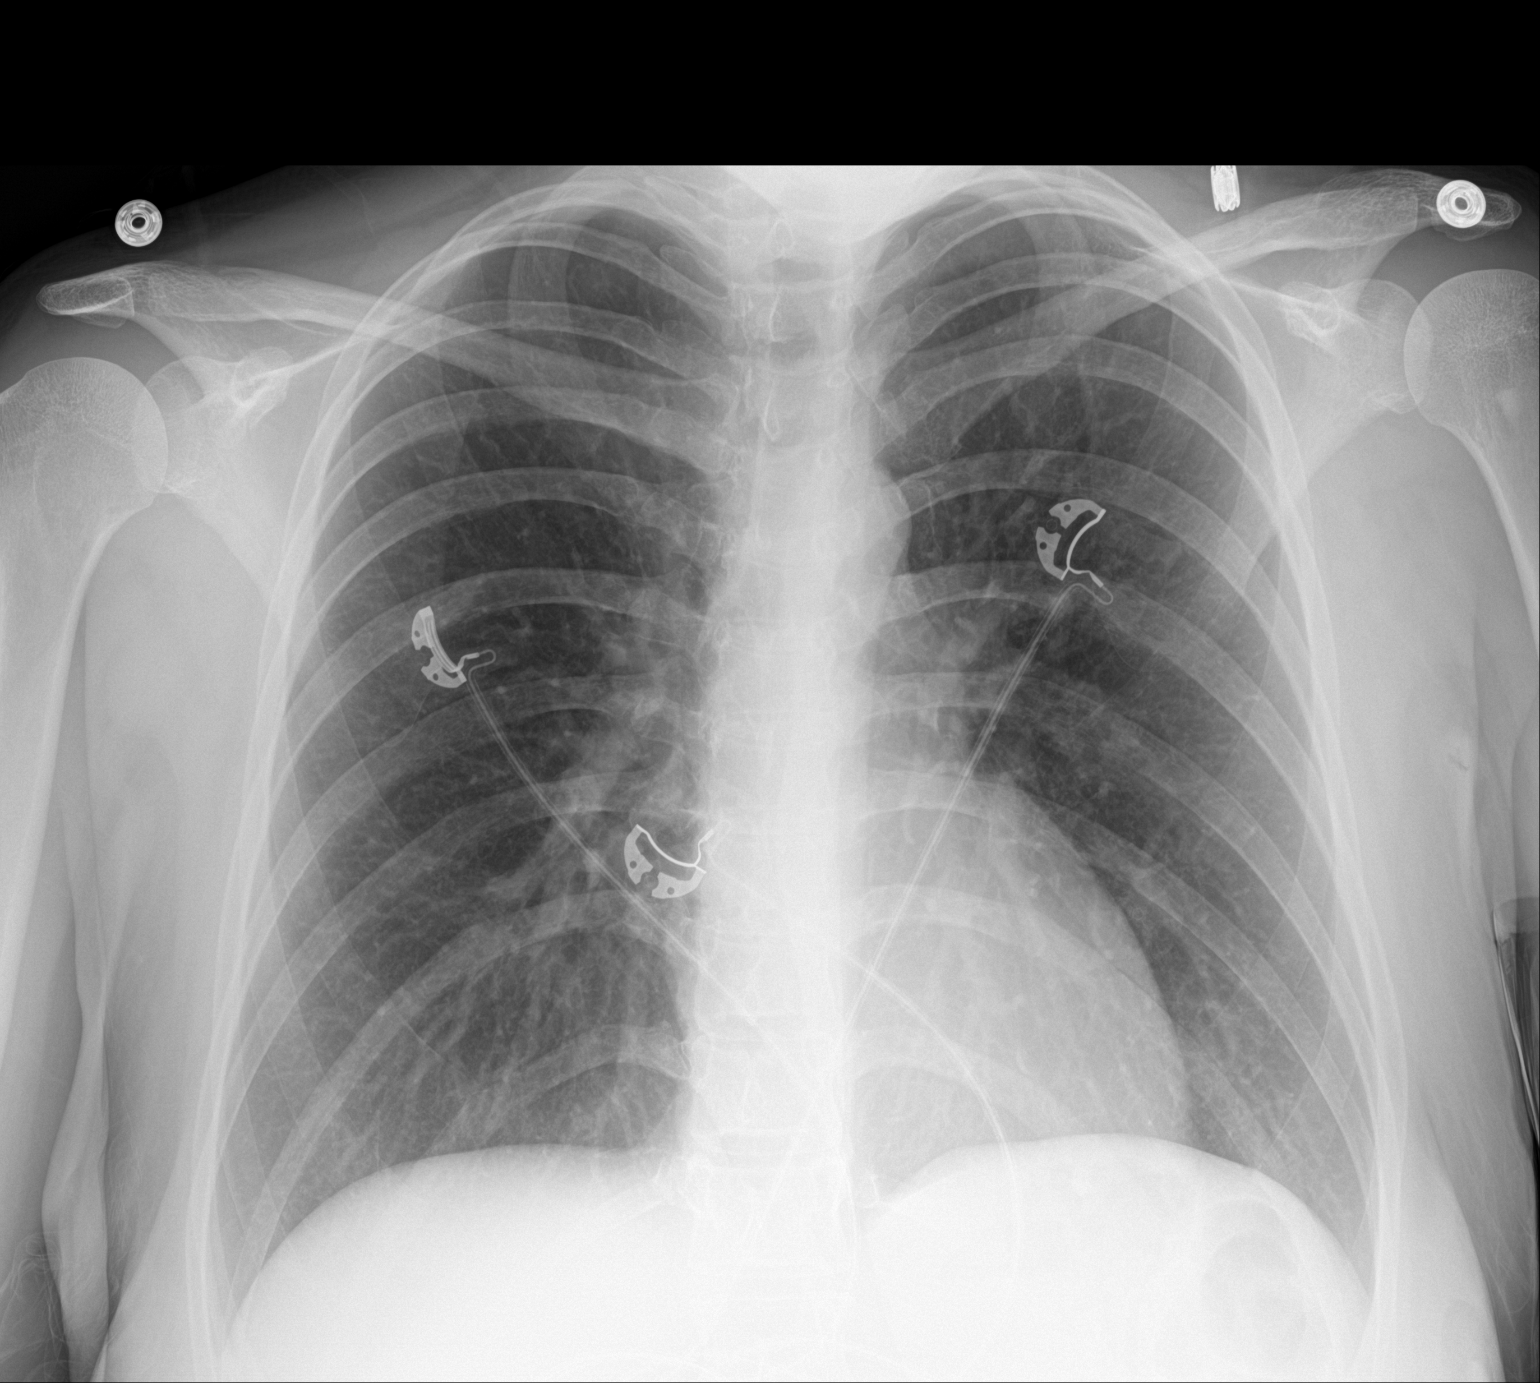

[1 of 1 positions shown; findings below may reference images not displayed]

FINDINGS: Lungs are adequately inflated and otherwise clear. Cardiomediastinal
silhouette and remainder of the exam is unchanged. Bone island over
the left humeral neck.
IMPRESSION: No active disease.

## 2021-04-10 IMAGING — CT CT NECK W/ CM
3 of 4 series · 11 of 33 positions shown, 13 images · IV contrast (omnipaque)
Comparison: None.

CLINICAL DATA: Feeling of tightness in throat.

EXAM:
CT NECK WITH CONTRAST
TECHNIQUE: Multidetector CT imaging of the neck was performed using the
standard protocol following the bolus administration of intravenous
contrast.
CONTRAST:  75mL OMNIPAQUE IOHEXOL 300 MG/ML  SOLN

[Series 5: axial · axial · 0.30mm/px · z∈[-266,-110]mm · 3 of 156 slices shown, 4 images]
[im 45/156  soft-tissue]
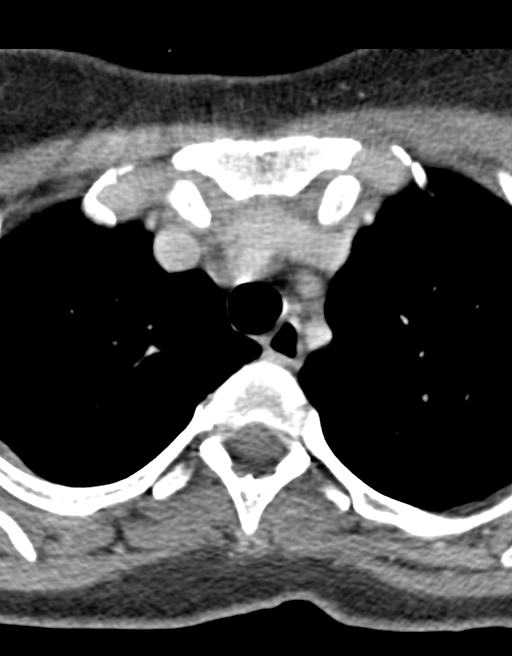
[im 45/156  bone]
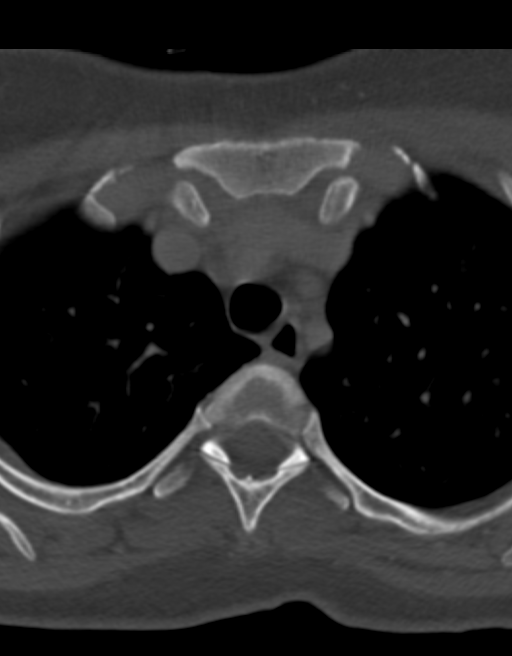
[im 89/156  bone]
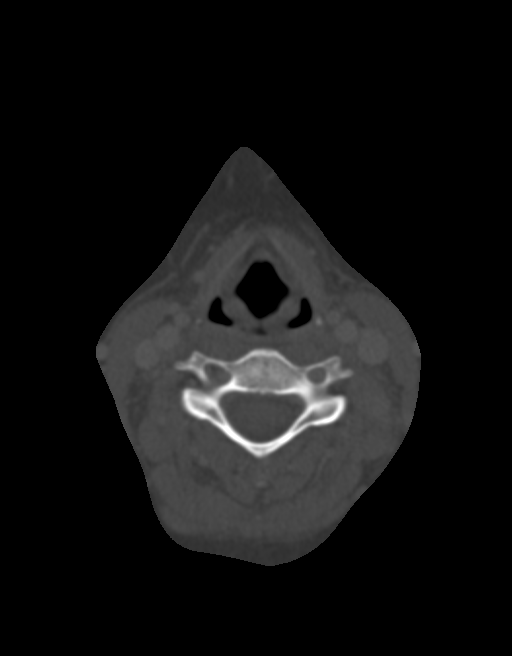
[im 133/156  bone]
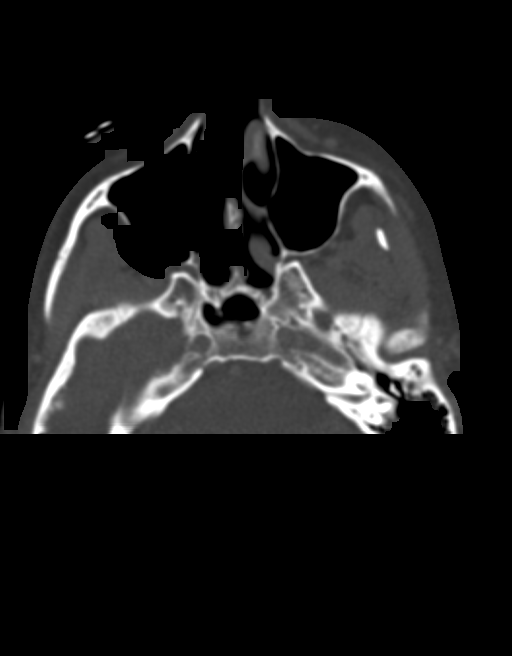

[Series 6: coronal · coronal · 0.30mm/px · 3 of 101 slices shown]
[im 34/101  bone]
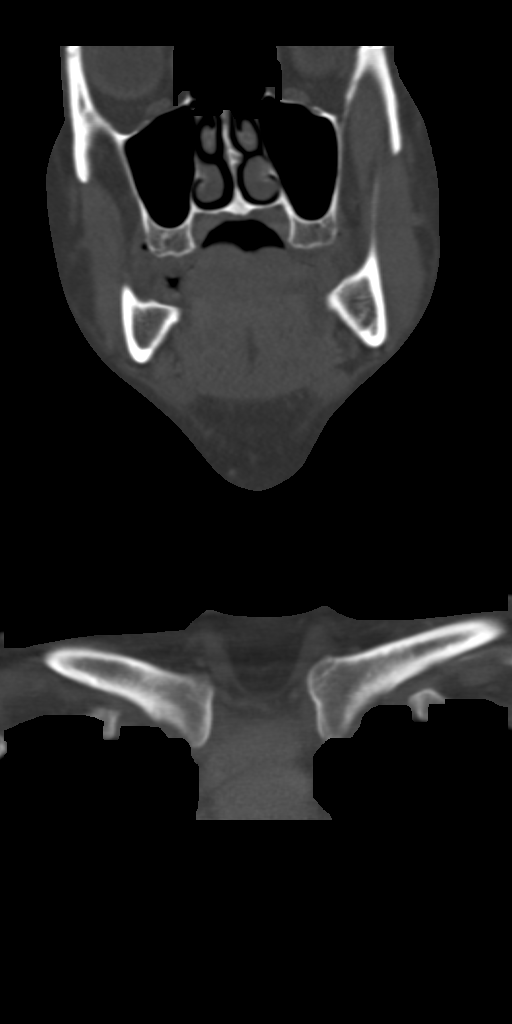
[im 45/101  bone]
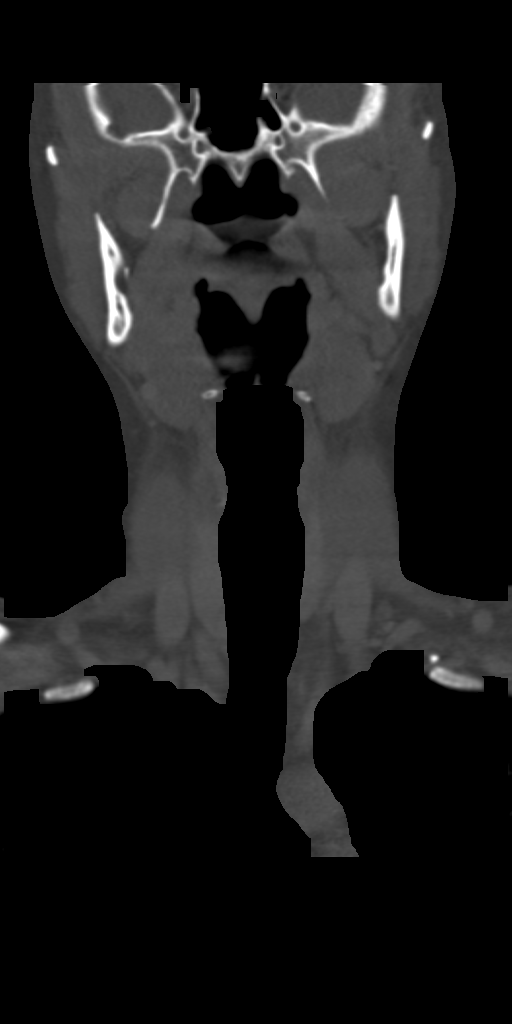
[im 56/101  bone]
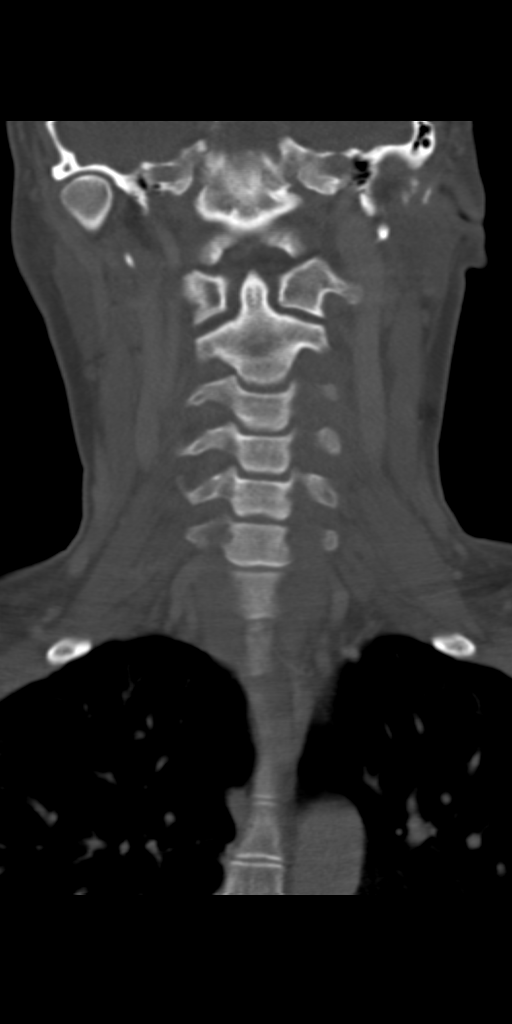

[Series 7: sagittal · sagittal · 0.39mm/px · 5 of 79 slices shown, 6 images]
[im 27/79  bone]
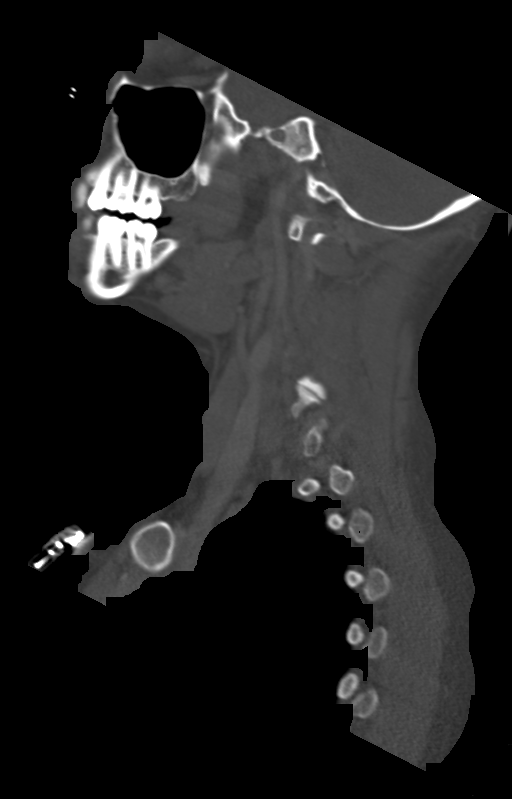
[im 33/79  bone]
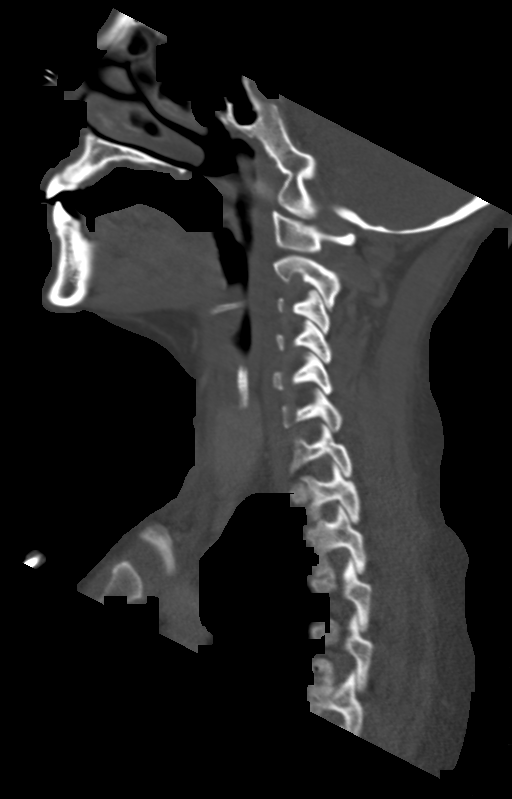
[im 40/79  soft-tissue]
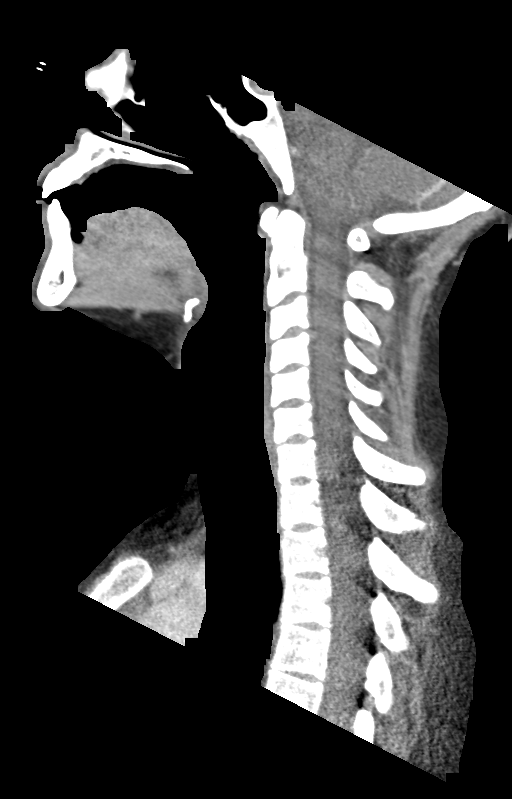
[im 40/79  bone]
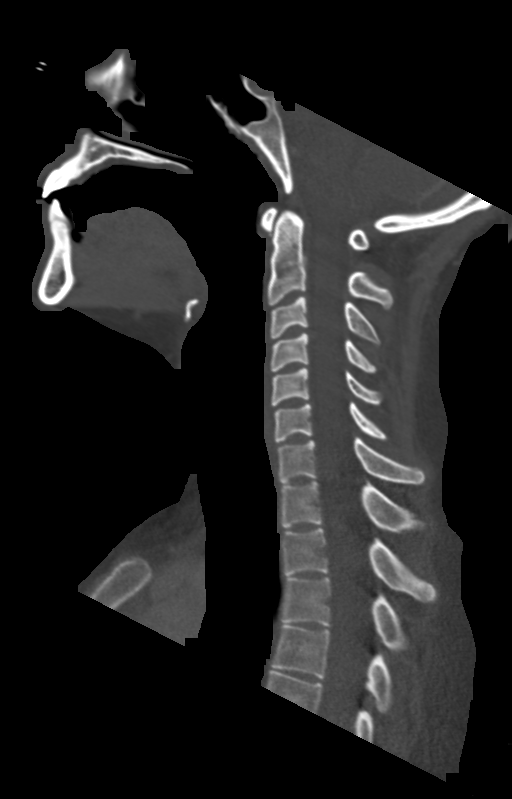
[im 46/79  bone]
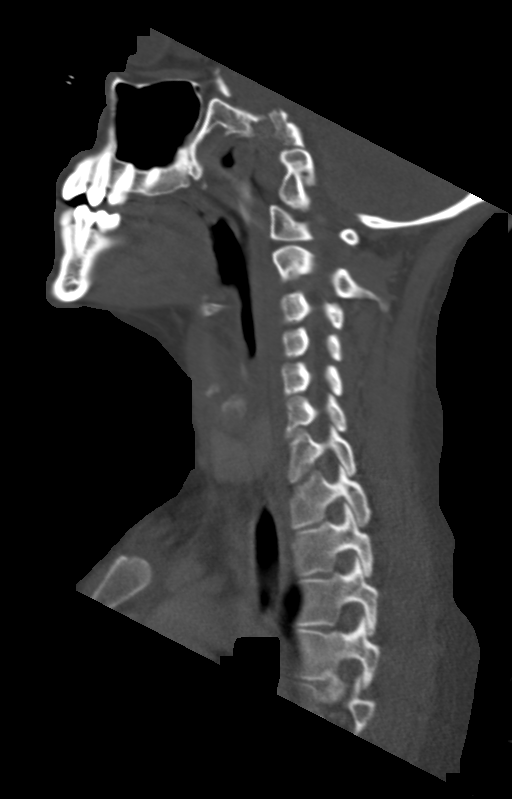
[im 53/79  bone]
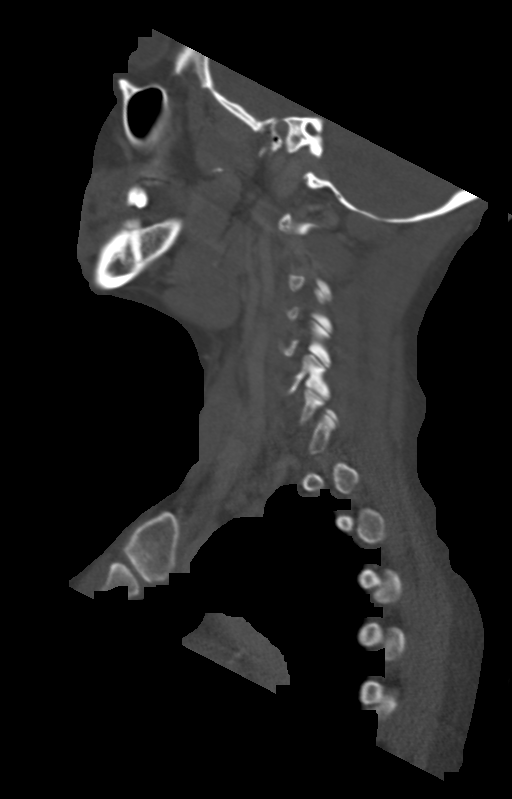

[11 of 33 positions shown; findings below may reference images not displayed]

FINDINGS: Pharynx and larynx: Normal. No mass or swelling. Normal larynx and
epiglottis.

Salivary glands: No inflammation, mass, or stone.

Thyroid: Negative

Lymph nodes: No enlarged lymph nodes in the neck.

Vascular: Normal vascular enhancement.

Limited intracranial: Negative

Visualized orbits: Negative

Mastoids and visualized paranasal sinuses: Negative

Skeleton: Negative

Upper chest: Negative

Other: None
IMPRESSION: Negative CT neck. No mass or inflammatory process in the neck.
Normal larynx.

## 2021-04-13 DIAGNOSIS — F32A Depression, unspecified: Secondary | ICD-10-CM | POA: Diagnosis not present

## 2021-04-13 DIAGNOSIS — J45909 Unspecified asthma, uncomplicated: Secondary | ICD-10-CM | POA: Diagnosis not present

## 2021-04-13 DIAGNOSIS — T7809XA Anaphylactic reaction due to other food products, initial encounter: Secondary | ICD-10-CM | POA: Diagnosis not present

## 2021-04-13 DIAGNOSIS — F419 Anxiety disorder, unspecified: Secondary | ICD-10-CM | POA: Diagnosis not present

## 2021-04-13 DIAGNOSIS — R0602 Shortness of breath: Secondary | ICD-10-CM | POA: Diagnosis not present

## 2021-04-13 DIAGNOSIS — K219 Gastro-esophageal reflux disease without esophagitis: Secondary | ICD-10-CM | POA: Diagnosis not present

## 2021-04-13 DIAGNOSIS — T7800XA Anaphylactic reaction due to unspecified food, initial encounter: Secondary | ICD-10-CM | POA: Diagnosis not present

## 2021-04-13 DIAGNOSIS — T7840XA Allergy, unspecified, initial encounter: Secondary | ICD-10-CM | POA: Diagnosis not present

## 2021-04-17 DIAGNOSIS — T782XXA Anaphylactic shock, unspecified, initial encounter: Secondary | ICD-10-CM | POA: Diagnosis not present

## 2021-04-17 DIAGNOSIS — J383 Other diseases of vocal cords: Secondary | ICD-10-CM | POA: Diagnosis not present

## 2021-04-26 DIAGNOSIS — R5383 Other fatigue: Secondary | ICD-10-CM | POA: Diagnosis not present

## 2021-04-26 DIAGNOSIS — R0981 Nasal congestion: Secondary | ICD-10-CM | POA: Diagnosis not present

## 2021-04-26 DIAGNOSIS — R059 Cough, unspecified: Secondary | ICD-10-CM | POA: Diagnosis not present

## 2021-04-26 DIAGNOSIS — U071 COVID-19: Secondary | ICD-10-CM | POA: Diagnosis not present

## 2021-04-27 ENCOUNTER — Other Ambulatory Visit

## 2021-05-03 ENCOUNTER — Other Ambulatory Visit

## 2021-05-04 ENCOUNTER — Ambulatory Visit: Admit: 2021-05-04 | Discharge: 2021-05-04 | Payer: MEDICAID | Attending: Optometrist

## 2021-05-04 ENCOUNTER — Encounter (INDEPENDENT_AMBULATORY_CARE_PROVIDER_SITE_OTHER): Admitting: Optometrist

## 2021-05-04 ENCOUNTER — Encounter

## 2021-05-04 ENCOUNTER — Other Ambulatory Visit

## 2021-05-04 DIAGNOSIS — H5213 Myopia, bilateral: Secondary | ICD-10-CM

## 2021-05-04 NOTE — Progress Notes (Signed)
 Assessment/Plan   1. Myopia of both eyes   Released new rx for glasses for FTW. Will trial dailies total 1 contacts for dry eye, pt to call and finalize. BCVA 20/20 OU. UV protection recommended. Monitor annually or sooner prn.$30 fee       2. Dry eyes  Minimally symptomatic at this time. Reduced TBUT OU. Discussed use of OTC lubricating ATs 2-4x per day OU and warm compresses BID OU for 5-10 min. Monitor annually or sooner prn.

## 2021-05-18 DIAGNOSIS — R059 Cough, unspecified: Secondary | ICD-10-CM | POA: Diagnosis not present

## 2021-05-18 DIAGNOSIS — U071 COVID-19: Secondary | ICD-10-CM | POA: Diagnosis not present

## 2021-05-18 DIAGNOSIS — R21 Rash and other nonspecific skin eruption: Secondary | ICD-10-CM | POA: Diagnosis not present

## 2021-05-30 DIAGNOSIS — R0602 Shortness of breath: Secondary | ICD-10-CM | POA: Diagnosis not present

## 2021-05-30 DIAGNOSIS — Z6827 Body mass index (BMI) 27.0-27.9, adult: Secondary | ICD-10-CM | POA: Diagnosis not present

## 2021-05-30 DIAGNOSIS — J383 Other diseases of vocal cords: Secondary | ICD-10-CM | POA: Diagnosis not present

## 2021-05-30 DIAGNOSIS — R490 Dysphonia: Secondary | ICD-10-CM | POA: Diagnosis not present

## 2021-06-02 ENCOUNTER — Emergency Department (HOSPITAL_COMMUNITY): Payer: BC Managed Care – PPO

## 2021-06-02 ENCOUNTER — Other Ambulatory Visit: Payer: Self-pay

## 2021-06-02 ENCOUNTER — Emergency Department (HOSPITAL_COMMUNITY)
Admission: EM | Admit: 2021-06-02 | Discharge: 2021-06-03 | Disposition: A | Discharge status: Home / Self Care | Payer: BC Managed Care – PPO | Attending: Emergency Medicine | Admitting: Emergency Medicine

## 2021-06-02 ENCOUNTER — Encounter (HOSPITAL_COMMUNITY): Payer: Self-pay

## 2021-06-02 DIAGNOSIS — J45909 Unspecified asthma, uncomplicated: Secondary | ICD-10-CM | POA: Diagnosis not present

## 2021-06-02 DIAGNOSIS — R1084 Generalized abdominal pain: Secondary | ICD-10-CM

## 2021-06-02 DIAGNOSIS — R109 Unspecified abdominal pain: Secondary | ICD-10-CM | POA: Diagnosis not present

## 2021-06-02 DIAGNOSIS — R1031 Right lower quadrant pain: Secondary | ICD-10-CM | POA: Diagnosis not present

## 2021-06-02 DIAGNOSIS — N390 Urinary tract infection, site not specified: Secondary | ICD-10-CM | POA: Insufficient documentation

## 2021-06-02 DIAGNOSIS — R1011 Right upper quadrant pain: Secondary | ICD-10-CM | POA: Diagnosis not present

## 2021-06-02 LAB — CBC
HCT: 38.3 % (ref 36.0–46.0)
Hemoglobin: 13 g/dL (ref 12.0–15.0)
MCH: 31.4 pg (ref 26.0–34.0)
MCHC: 33.9 g/dL (ref 30.0–36.0)
MCV: 92.5 fL (ref 80.0–100.0)
Platelets: 197 10*3/uL (ref 150–400)
RBC: 4.14 MIL/uL (ref 3.87–5.11)
RDW: 12.8 % (ref 11.5–15.5)
WBC: 8.2 10*3/uL (ref 4.0–10.5)
nRBC: 0 % (ref 0.0–0.2)

## 2021-06-02 LAB — URINALYSIS, ROUTINE W REFLEX MICROSCOPIC
Bilirubin Urine: NEGATIVE
Glucose, UA: NEGATIVE mg/dL
Hgb urine dipstick: NEGATIVE
Ketones, ur: 80 mg/dL — AB
Nitrite: NEGATIVE
Protein, ur: NEGATIVE mg/dL
Specific Gravity, Urine: 1.012 (ref 1.005–1.030)
pH: 6 (ref 5.0–8.0)

## 2021-06-02 LAB — COMPREHENSIVE METABOLIC PANEL
ALT: 17 U/L (ref 0–44)
AST: 20 U/L (ref 15–41)
Albumin: 4.6 g/dL (ref 3.5–5.0)
Alkaline Phosphatase: 43 U/L (ref 38–126)
Anion gap: 9 (ref 5–15)
BUN: 12 mg/dL (ref 6–20)
CO2: 23 mmol/L (ref 22–32)
Calcium: 9.5 mg/dL (ref 8.9–10.3)
Chloride: 104 mmol/L (ref 98–111)
Creatinine, Ser: 0.48 mg/dL (ref 0.44–1.00)
GFR, Estimated: >60 mL/min (ref >60)
Glucose, Bld: 73 mg/dL (ref 70–99)
Potassium: 3.8 mmol/L (ref 3.5–5.1)
Sodium: 136 mmol/L (ref 135–145)
Total Bilirubin: 1 mg/dL (ref 0.3–1.2)
Total Protein: 7.3 g/dL (ref 6.5–8.1)

## 2021-06-02 LAB — I-STAT BETA HCG BLOOD, ED (MC, WL, AP ONLY): I-stat hCG, quantitative: <5 m[IU]/mL (ref <5)

## 2021-06-02 LAB — LIPASE, BLOOD: Lipase: 33 U/L (ref 11–51)

## 2021-06-02 MED ORDER — LACTATED RINGERS IV BOLUS
1000.0000 mL | Freq: Once | INTRAVENOUS | Status: AC
  Administered 2021-06-03: 1000 mL via INTRAVENOUS

## 2021-06-02 MED ORDER — CIPROFLOXACIN IN D5W 400 MG/200ML IV SOLN
400.0000 mg | Freq: Once | INTRAVENOUS | Status: AC
  Administered 2021-06-03: 400 mg via INTRAVENOUS
  Filled 2021-06-02: qty 200

## 2021-06-02 MED ORDER — CEFTRIAXONE SODIUM 2 G IJ SOLR
2.0000 g | Freq: Once | INTRAMUSCULAR | Status: DC
Start: 2021-06-02 — End: 2021-06-02

## 2021-06-02 NOTE — ED Provider Notes (Signed)
Emergency Medicine Provider Triage Evaluation Note  Leslie Thompson , a 35 y.o. female  was evaluated in triage.  Pt complains of abd pain.  Review of Systems  Positive: Abd pain Negative: Fever, n/v/d, dysuria,  hematuria, loss of appetite  Physical Exam  BP 129/80 (BP Location: Right Arm)   Pulse 84   Temp 98.6 F (37 C) (Oral)   Resp 16   Ht 5\' 3"  (1.6 m)   Wt 68.5 kg   LMP 05/12/2021   SpO2 100%   BMI 26.75 kg/m  Gen:   Awake, no distress   Resp:  Normal effort  MSK:   Moves extremities without difficulty  Other:  RUQ tenderness, mild RLQ tenderness.  Negative Murphy sign, no pain at [facility] Decision Making  Medically screening exam initiated at 3:40 PM.  Appropriate orders placed.  [facility] was informed that the remainder of the evaluation will be completed by another provider, this initial triage assessment does not replace that evaluation, and the importance of remaining in the ED until their evaluation is complete.  Patient developed pain to the right side of abdomen since yesterday has been recurrent and radiates towards her upper back.  Was seen by her PCP but was recommended to come to ER to assess for her gallbladder or appendix disease.  Does have an appetite but afraid to eat.  Did recall swimming yesterday but does not think is related to any strenuous activity.  Last menstrual period was 05/12/2021   07/13/2021, PA-C 06/02/21 1542    06/04/21, MD 06/02/21 [phone]

## 2021-06-02 NOTE — ED Triage Notes (Addendum)
Patient c/o RLQ abdominal pain that started yesterday. Patient states that the pain radiates into the RUQ . Patient states she saw her PCP today and was instructed to come to the ED to r/o gallbladder or appendix  issues.  Patient denies any n/v/D.  Patient added that she has pain in the upper back as well.

## 2021-06-02 NOTE — ED Provider Notes (Signed)
[facility]-EMERGENCY DEPT Provider Note   CSN: [id number] Arrival date & time: 06/02/21  1332     History Chief Complaint  Patient presents with   Abdominal Pain    Leslie Thompson is a 35 y.o. female.  The history is provided by the patient.  Abdominal Pain She has history of asthma, depression and comes in because of right-sided abdominal and flank pain which started yesterday and got worse today.  Pain is a constant dull ache with occasional sharp pains.  She currently rates her pain at 7/10.  It is better if she stands or lays down, worse when she sits.  She had momentary nausea yesterday, none today.  She denies constipation or diarrhea.  Her appetite has been good, but she has not eaten or drank anything because she was worried it would make the pain worse.  She denies fever, chills, sweats.  She denies any urinary urgency, frequency, tenesmus, dysuria.  She saw her primary care provider who was concerned about possible gallbladder disease, possible appendicitis and sent her to the emergency department.   Past Medical History:  Diagnosis Date   Anxiety    Asthma    Depression     Patient Active Problem List   Diagnosis Date Noted   DEPRESSION 06/18/2007   ASTHMA 06/18/2007   SEIZURE DISORDER 06/18/2007    Past Surgical History:  Procedure Laterality Date   WISDOM TOOTH EXTRACTION       OB History   No obstetric history on file.     Family History  Problem Relation Age of Onset   High Cholesterol Mother    High Cholesterol Father     Social History   Tobacco Use   Smoking status: Never   Smokeless tobacco: Never  Vaping Use   Vaping Use: Never used  Substance Use Topics   Alcohol use: Not Currently   Drug use: No    Home Medications Prior to Admission medications   Medication Sig Start Date End Date Taking? Authorizing Provider  albuterol (PROVENTIL HFA;VENTOLIN HFA) 108 (90 BASE) MCG/ACT inhaler Inhale 2 puffs into the lungs  every 6 (six) hours as needed for wheezing.    [provider]  EPINEPHrine 0.3 mg/0.3 mL IJ SOAJ injection Inject 0.3 mLs (0.3 mg total) into the muscle as needed for anaphylaxis. 03/04/20   Maxwell Caul, PA-C  famotidine (PEPCID) 20 MG tablet Take 1 tablet (20 mg total) by mouth 2 (two) times daily. 03/11/20   Domenick Gong, MD  hydrOXYzine (ATARAX/VISTARIL) 25 MG tablet Take 1 tablet (25 mg total) by mouth every 6 (six) hours as needed for itching. 03/11/20   Domenick Gong, MD  loratadine (CLARITIN) 10 MG tablet Take 10 mg by mouth daily.    [provider]  methylPREDNISolone (MEDROL DOSEPAK) 4 MG TBPK tablet Take by mouth daily. Follow package instructions 03/11/20   Domenick Gong, MD  pantoprazole (PROTONIX) 20 MG tablet Take 1 tablet (20 mg total) by mouth daily. 03/11/20   Domenick Gong, MD  diphenhydrAMINE (BENADRYL) 25 MG tablet Take 1 tablet (25 mg total) by mouth every 6 (six) hours as needed for up to 5 days. 03/04/20 03/11/20  Maxwell Caul, PA-C    Allergies    Amaranth (fd&c red #2), Azithromycin, Pfizer-biontech covid-19 vacc [covid-19 mrna vacc (moderna)], Penicillins, and Naproxen  Review of Systems   Review of Systems  Gastrointestinal:  Positive for abdominal pain.  All other systems reviewed and are negative.  Physical  Exam Updated Vital Signs BP 128/76 (BP Location: Left Arm)   Pulse 88   Temp 98.6 F (37 C) (Oral)   Resp 16   Ht 5\' 3"  (1.6 m)   Wt 68.5 kg   LMP 05/12/2021   SpO2 100%   BMI 26.75 kg/m   Physical Exam Vitals and nursing note reviewed.  35 year old female, resting comfortably and in no acute distress. Vital signs are normal. Oxygen saturation is 100%, which is normal. Head is normocephalic and atraumatic. PERRLA, EOMI. Oropharynx is clear. Neck is nontender and supple without adenopathy or JVD. Back is nontender in the midline.  There is moderate right CVA tenderness. Lungs are clear without rales, wheezes, or  rhonchi. Chest is nontender. Heart has regular rate and rhythm without murmur. Abdomen is soft, flat, with mild vague soreness to palpation diffusely.  There is no rebound or guarding.  There are no masses or hepatosplenomegaly and peristalsis is slightly hypoactive. Extremities have no cyanosis or edema, full range of motion is present. Skin is warm and dry without rash. Neurologic: Mental status is normal, cranial nerves are intact, there are no motor or sensory deficits.  ED Results / Procedures / Treatments   Labs (all labs ordered are listed, but only abnormal results are displayed) Labs Reviewed  URINALYSIS, ROUTINE W REFLEX MICROSCOPIC - Abnormal; Notable for the following components:      Result Value   APPearance HAZY (*)    Ketones, ur 80 (*)    Leukocytes,Ua LARGE (*)    Bacteria, UA MANY (*)    All other components within normal limits  LIPASE, BLOOD  COMPREHENSIVE METABOLIC PANEL  CBC  I-STAT BETA HCG BLOOD, ED ([facility], [facility], [facility] ONLY)   Radiology CT ABDOMEN PELVIS W CONTRAST  Result Date: 06/03/2021 CLINICAL DATA:  Right lower quadrant pain, appendicitis suspected. EXAM: CT ABDOMEN AND PELVIS WITH CONTRAST TECHNIQUE: Multidetector CT imaging of the abdomen and pelvis was performed using the standard protocol following bolus administration of intravenous contrast. CONTRAST:  25mL OMNIPAQUE IOHEXOL 350 MG/ML SOLN COMPARISON:  Right upper quadrant ultrasound yesterday. CT 03/02/2019 FINDINGS: Lower chest: No pleural effusion or acute airspace disease. Normal heart size. Hepatobiliary: Again seen mild focal fatty infiltration adjacent to the falciform ligament. No suspicious liver lesion. No gallstones, gallbladder wall thickening, or biliary dilatation. Pancreas: No ductal dilatation or inflammation. Spleen: Normal in size without focal abnormality. Adrenals/Urinary Tract: Normal adrenal glands. No hydronephrosis. Homogeneous renal enhancement. There is symmetric early excretion from  both renal collecting systems. Unremarkable urinary bladder. Stomach/Bowel: Normal appendix, for example coronal series 5 images 54 through 64. No appendicitis. The stomach is partially distended, unremarkable. There is no small bowel obstruction or inflammation. Normal terminal ileum. Moderate volume of stool throughout the colon. No abnormal rectal distention. No colonic inflammation. Vascular/Lymphatic: Normal caliber abdominal aorta. Patent portal vein. Patent splenic vein. No acute vascular findings. No abdominopelvic adenopathy. Reproductive: Unremarkable CT appearance of the uterus. 17 mm peripherally enhancing cyst in the right ovary is consistent with corpus luteum. Left ovary is normal in size. Trace free fluid in the dependent pelvis. Other: Trace free fluid in the dependent pelvis. No free air. No focal fluid collection. No abdominal wall hernia. Musculoskeletal: There are no acute or suspicious osseous abnormalities. IMPRESSION: 1. Normal appendix. 2. Moderate colonic stool burden, can be seen with constipation. 3. Right ovarian corpus luteum, physiologic but can be cause of right lower quadrant pain. Trace free fluid in the pelvis is likely physiologic. Electronically  Signed   By: Narda Rutherford M.D.   On: 06/03/2021 01:40   US Abdomen Limited  Result Date: 06/02/2021 CLINICAL DATA:  Abdominal pain. EXAM: ULTRASOUND ABDOMEN LIMITED RIGHT UPPER QUADRANT COMPARISON:  None. FINDINGS: Gallbladder: No gallstones or wall thickening visualized. No sonographic Murphy sign noted by sonographer. Common bile duct: Diameter: 2 mm, within normal limits. Liver: No focal lesion identified. Within normal limits in parenchymal echogenicity. Portal vein is patent on color Doppler imaging with normal direction of blood flow towards the liver. Other: None. IMPRESSION: Negative. No hepatobiliary abnormality identified. Electronically Signed   By: Danae Orleans M.D.   On: 06/02/2021 17:41    Procedures Procedures    Medications Ordered in ED Medications  sodium chloride flush (NS) 0.9 % injection 10-40 mL (10 mLs Intracatheter Given 06/03/21 0045)  sodium chloride flush (NS) 0.9 % injection 10-40 mL (has no administration in time range)  lactated ringers bolus 1,000 mL (1,000 mLs Intravenous New Bag/Given (Non-Interop) 06/03/21 0048)  ciprofloxacin (CIPRO) IVPB 400 mg (400 mg Intravenous New Bag/Given 06/03/21 0050)  iohexol (OMNIPAQUE) 350 MG/ML injection 80 mL (80 mLs Intravenous Contrast Given 06/03/21 0115)  acetaminophen (TYLENOL) tablet 650 mg (650 mg Oral Given 06/03/21 0128)    ED Course  I have reviewed the triage vital signs and the nursing notes.  Pertinent labs & imaging results that were available during my care of the patient were reviewed by me and considered in my medical decision making (see chart for details).   MDM Rules/Calculators/A&P                         Right-sided abdominal and flank pain.  Differential includes cholecystitis, pancreatitis, appendicitis, urinary tract infection, renal colic, diverticulitis.  Labs and right upper quadrant ultrasound were obtained prior to my seeing the patient.  Right upper quadrant ultrasound is unremarkable.  Labs show normal WBC, normal lipase, normal hepatic function studies.  Urinalysis is significant for ketonuria, many bacteria, 21-50 WBCs with 0-5 squamous epithelial cells.  With this information, I feel fairly certain that this is a urinary tract infection and have explained this to the patient.  She expressed some concern that her friend had a similar presentation and was sent home and eventually had appendicitis and she would prefer to have CT of abdomen and pelvis done.  This is ordered.  Old records are reviewed, and she has no relevant past visits, but did have a CT of abdomen and pelvis 02/10/2019 to evaluate for appendicitis with only finding being a right sided complex adnexal cyst.  Follow-up ultrasounds showed spontaneous resolution of  cyst.  CT scan shows no acute process.  Incidental finding of right corpus luteum cyst.  No follow-up needed.  She is discharged with prescription for ciprofloxacin, recommended follow-up with PCP in 1 week.  Return precautions discussed.  Final Clinical Impression(s) / ED Diagnoses Final diagnoses:  Urinary tract infection without hematuria, site unspecified  Generalized abdominal pain    Rx / DC Orders ED Discharge Orders          Ordered    ciprofloxacin (CIPRO) 500 MG tablet  2 times daily        06/03/21 0157             Dione Booze, MD 06/03/21 0201

## 2021-06-03 ENCOUNTER — Encounter (HOSPITAL_COMMUNITY): Payer: Self-pay

## 2021-06-03 ENCOUNTER — Emergency Department (HOSPITAL_COMMUNITY): Payer: BC Managed Care – PPO

## 2021-06-03 DIAGNOSIS — R1031 Right lower quadrant pain: Secondary | ICD-10-CM | POA: Diagnosis not present

## 2021-06-03 MED ORDER — SODIUM CHLORIDE 0.9% FLUSH
10.0000 mL | Freq: Two times a day (BID) | INTRAVENOUS | Status: DC
  Administered 2021-06-03: 10 mL

## 2021-06-03 MED ORDER — IOHEXOL 350 MG/ML SOLN
80.0000 mL | Freq: Once | INTRAVENOUS | Status: AC | PRN
  Administered 2021-06-03: 80 mL via INTRAVENOUS

## 2021-06-03 MED ORDER — CIPROFLOXACIN HCL 500 MG PO TABS: 500.0000 mg | ORAL_TABLET | Freq: Two times a day (BID) | ORAL | 0 refills | Status: DC

## 2021-06-03 MED ORDER — SODIUM CHLORIDE 0.9% FLUSH: 10.0000 mL | INTRAVENOUS | Status: DC | PRN

## 2021-06-03 MED ORDER — ACETAMINOPHEN 325 MG PO TABS
650.0000 mg | ORAL_TABLET | Freq: Once | ORAL | Status: AC
  Administered 2021-06-03: 650 mg via ORAL
  Filled 2021-06-03: qty 2

## 2021-06-03 NOTE — ED Notes (Signed)
Patient returned from CT

## 2021-06-03 NOTE — Discharge Instructions (Addendum)
Your CT scan did not show any sign of any serious problem in your abdomen.  However, if pain seems to be getting worse, please feel free to return for reevaluation.

## 2021-06-03 NOTE — ED Notes (Signed)
Patient transported to CT 

## 2021-06-03 NOTE — ED Notes (Signed)
IV team at bedside 

## 2021-12-01 ENCOUNTER — Encounter (HOSPITAL_BASED_OUTPATIENT_CLINIC_OR_DEPARTMENT_OTHER): Payer: Self-pay | Admitting: Community Health Worker

## 2021-12-01 ENCOUNTER — Telehealth (HOSPITAL_BASED_OUTPATIENT_CLINIC_OR_DEPARTMENT_OTHER): Payer: Self-pay | Admitting: Community Health Worker

## 2021-12-01 NOTE — Telephone Encounter (Signed)
This pt called the [facility] info line requesting an appt for routine STI screening. Demographics collected and confirmed. Appt sche'd. This pt denied having any further questions or concerns at this time.

## 2021-12-04 ENCOUNTER — Ambulatory Visit: Payer: Medicaid PCCM (Primary Care Case Management) | Admitting: Community Health Worker

## 2021-12-04 DIAGNOSIS — Z717 Human immunodeficiency virus [HIV] counseling: Secondary | ICD-10-CM

## 2021-12-04 NOTE — Progress Notes (Signed)
--Subjective: History:    No chief complaint on file.      No current outpatient medications on file.     No current facility-administered medications for this visit.       Allergies: Patient has no allergy information on record.    Specific reason for testing: Mary Vang is a 36 yrs old female who is here today present at the clinic looking to get counseling and screening for HIV/STIs. Pt states that they had an unprotected sexual encounter 3 days ago with a new female partner.  Pt states they were last screened for STIs 2 months ago and that they are just curious about their sexual health. Pt denies having any symptoms such as urinary frequency, abdominal pain, blood in urine, discharge, lesions, pustules, papules, non-healing ulcers, or fevers.    -Disc:  Transmission (Blood, Semen, Vaginal Fluid and Breastmilk), Risky Behaviors, Non-Risks/Myths,  Seroconversion Period , Result Waiting Time, Possible Results, Feelings Surrounding Possible Results, Plans for Informing of Results, Support Structures and Hep C Risk.     Risk Assessment Completed:  yes  Piercing/Tattoo: yes,   Known Partner Risks: no  IV Drug Use: no    Sexual History:Het Female    Current Partner:Malle partners    # of partners (Last Year): 3    Sexual Partners: Male    Sexual Practices: Vaginal, Anal, Oral    Condom Use: Always    Condom Breakage:No    Condom Instruction Given:No    Risk Reduction Plans:Pt is aware that condom use is the most effective way of protecting against HIV and STI's. Also, pt also is aware that partner minimization is another method that can be effective, with condom use as well.    --Objective:Pt was encouraged to get tested at least once a year or before and after every new partner    HX of STDs: (dx/tx)  Tested Before: Yes:  GC, dates 09/2021 Negative  Chlamydia, dates 09/2021 Negative  Syphilis, dates 09/2021 Negative  HIV, Dates 09/2021 Negative    DV Assessment Completed: yes    Hx of DV/IP abuse: no    Hx of  Sexual Assault: no    Sexual Coercion: no    Last Poss. Exposure:3 days ago (New partner, first encounter)    Protection used: No    Seroconversion Period Explained:Yes, pt is aware that they are early to comply with the seroconversion period. Pt has chosen to move forward with today's screen and come back for a second screen if deem necessary by pt. Patient new partner is getting tested as well per patient.     Testing Options Chosen  Readiness to test: Still want to test? yes,pt states that they are ready to test and would not harm themselves or others in the event that any of their results were to be positive.      Patient chose to be tested for:HIV, HCV, Chlamydia, GC and Syphilis    Test sent IO:[facility] lab    Type of test:Blood,Oral andVaginalswabs    --Assessment: HIV, HCV, and STI Education and Risk Reduction     --Plan: Education Counseling and Referrals    Consent for treatment  Informed Consent signed for HIV Testing  STD/HIV risk assessment done  Condoms offered andDeclined  Confidentiality    Referrals:   Support Person Plan: no  Primary Care: no  Support Services: no  Re-testing Scheduled: no    Follow Up Plan:Pt was screened for HIV, Hep C, Chlamydia, Gonorrhea and Syphilis today.  Pt was informed they will be getting a call within 3-7 business days with final test results; and was also informed test results will be available on MyChart.

## 2021-12-06 ENCOUNTER — Telehealth (HOSPITAL_BASED_OUTPATIENT_CLINIC_OR_DEPARTMENT_OTHER): Payer: Self-pay | Admitting: Community Health Worker

## 2021-12-06 LAB — ICTR HEPATITIS C: HEPATITIS C ANTIBODY: NONREACTIVE

## 2021-12-06 LAB — ICTR CHLAMYDIA/GONORRHEA
CHLAMYDIA NUCLEIC ACID AMPLIFI: NEGATIVE
CHLAMYDIA NUCLEIC ACID AMPLIFI: NEGATIVE
GONOCOCCUS NUCLEIC ACID AMPLIF: NEGATIVE
GONOCOCCUS NUCLEIC ACID AMPLIF: NEGATIVE

## 2021-12-06 LAB — ICTR HIV SERUM: HIV 1 AND 2 PLUS O SCREEN: NEGATIVE

## 2021-12-06 LAB — ICTR RPR: RPR: NONREACTIVE

## 2021-12-06 NOTE — Telephone Encounter (Signed)
This writer contacted pt who verified their name and dob to inform them of test results. Pt tested negative for HIV, Hep C, Chlamydia, Gonorrhea, and Syphilis. The patient understood and did not have any further questions or concerns at this time.

## 2022-04-11 DIAGNOSIS — R110 Nausea: Secondary | ICD-10-CM | POA: Diagnosis not present

## 2022-04-11 DIAGNOSIS — K219 Gastro-esophageal reflux disease without esophagitis: Secondary | ICD-10-CM | POA: Diagnosis not present

## 2022-04-11 DIAGNOSIS — F419 Anxiety disorder, unspecified: Secondary | ICD-10-CM | POA: Diagnosis not present

## 2022-04-11 DIAGNOSIS — F32A Depression, unspecified: Secondary | ICD-10-CM | POA: Diagnosis not present

## 2022-05-01 DIAGNOSIS — F411 Generalized anxiety disorder: Secondary | ICD-10-CM | POA: Diagnosis not present

## 2022-05-07 DIAGNOSIS — F411 Generalized anxiety disorder: Secondary | ICD-10-CM | POA: Diagnosis not present

## 2022-05-09 DIAGNOSIS — M62838 Other muscle spasm: Secondary | ICD-10-CM | POA: Diagnosis not present

## 2022-05-09 DIAGNOSIS — K219 Gastro-esophageal reflux disease without esophagitis: Secondary | ICD-10-CM | POA: Diagnosis not present

## 2022-05-10 ENCOUNTER — Encounter: Payer: MEDICAID | Attending: Optometrist

## 2022-05-10 ENCOUNTER — Encounter

## 2022-05-17 DIAGNOSIS — F411 Generalized anxiety disorder: Secondary | ICD-10-CM | POA: Diagnosis not present

## 2022-05-21 DIAGNOSIS — F411 Generalized anxiety disorder: Secondary | ICD-10-CM | POA: Diagnosis not present

## 2022-05-24 DIAGNOSIS — F411 Generalized anxiety disorder: Secondary | ICD-10-CM | POA: Diagnosis not present

## 2022-05-31 ENCOUNTER — Ambulatory Visit: Admit: 2022-05-31 | Discharge: 2022-05-31 | Payer: MEDICAID | Attending: Optometrist

## 2022-05-31 DIAGNOSIS — H5213 Myopia, bilateral: Secondary | ICD-10-CM

## 2022-05-31 NOTE — Progress Notes (Signed)
Assessment/Plan   1. Myopia of both eyes   Released new rx for glasses for FTW.  dailies total 1 contacts for dry eye, discussed hygiene and wear time. Emailed. BCVA 20/20 OU. UV protection recommended. Monitor annually or sooner prn.$40 fee       2. Dry eyes  Minimally symptomatic at this time. Reduced TBUT OU. Discussed use of OTC lubricating ATs 2-4x per day OU and warm compresses BID OU for 5-10 min. Monitor annually or sooner prn.

## 2022-06-05 DIAGNOSIS — F411 Generalized anxiety disorder: Secondary | ICD-10-CM | POA: Diagnosis not present

## 2022-06-07 DIAGNOSIS — F4310 Post-traumatic stress disorder, unspecified: Secondary | ICD-10-CM | POA: Diagnosis not present

## 2022-06-11 DIAGNOSIS — F419 Anxiety disorder, unspecified: Secondary | ICD-10-CM | POA: Diagnosis not present

## 2022-06-11 DIAGNOSIS — E162 Hypoglycemia, unspecified: Secondary | ICD-10-CM | POA: Diagnosis not present

## 2022-06-11 DIAGNOSIS — F4310 Post-traumatic stress disorder, unspecified: Secondary | ICD-10-CM | POA: Diagnosis not present

## 2022-06-11 DIAGNOSIS — K219 Gastro-esophageal reflux disease without esophagitis: Secondary | ICD-10-CM | POA: Diagnosis not present

## 2022-06-14 DIAGNOSIS — F4310 Post-traumatic stress disorder, unspecified: Secondary | ICD-10-CM | POA: Diagnosis not present

## 2022-06-19 DIAGNOSIS — F4310 Post-traumatic stress disorder, unspecified: Secondary | ICD-10-CM | POA: Diagnosis not present

## 2022-06-21 DIAGNOSIS — F4310 Post-traumatic stress disorder, unspecified: Secondary | ICD-10-CM | POA: Diagnosis not present

## 2022-06-27 DIAGNOSIS — F4310 Post-traumatic stress disorder, unspecified: Secondary | ICD-10-CM | POA: Diagnosis not present

## 2022-07-04 DIAGNOSIS — F4310 Post-traumatic stress disorder, unspecified: Secondary | ICD-10-CM | POA: Diagnosis not present

## 2022-07-09 IMAGING — US US ABDOMEN LIMITED
1 series · 14 of 25 positions shown · non-contrast
Comparison: None.

CLINICAL DATA: Abdominal pain.

EXAM:
ULTRASOUND ABDOMEN LIMITED RIGHT UPPER QUADRANT

[Series 1: us abdomen limited · 14 of 65 slices shown]
[im 1/65]
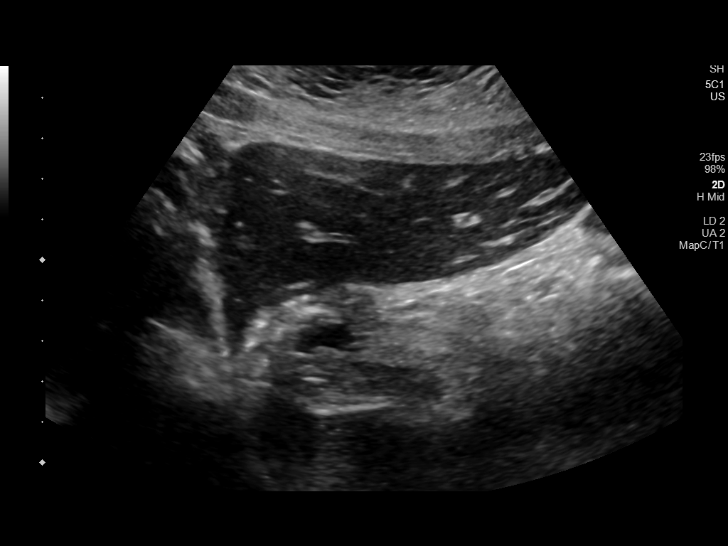
[im 6/65]
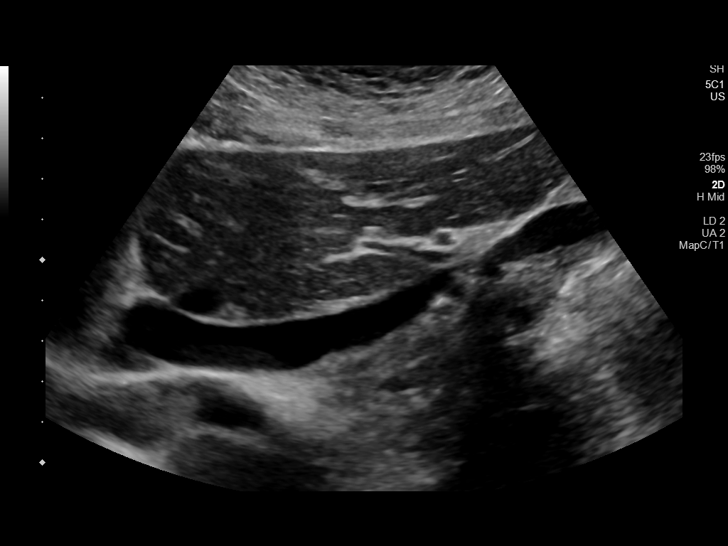
[im 11/65]
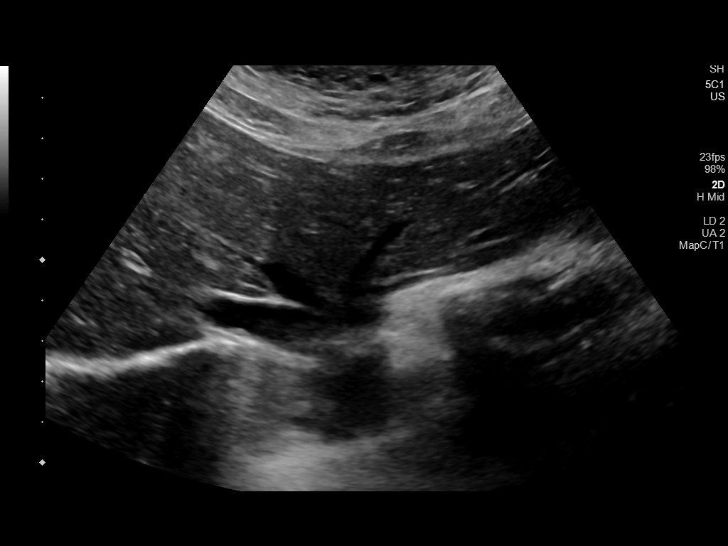
[im 17/65]
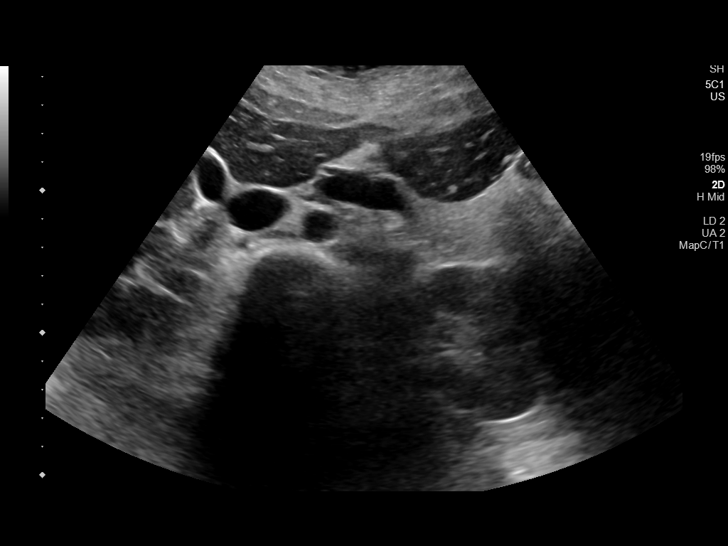
[im 22/65]
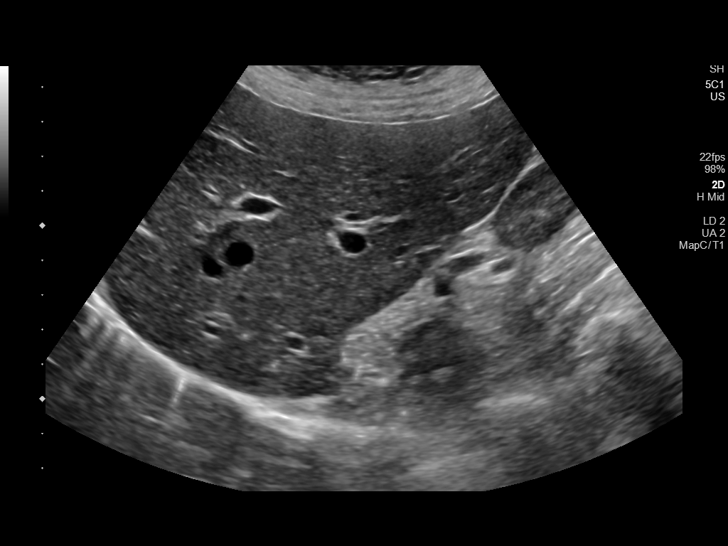
[im 25/65]
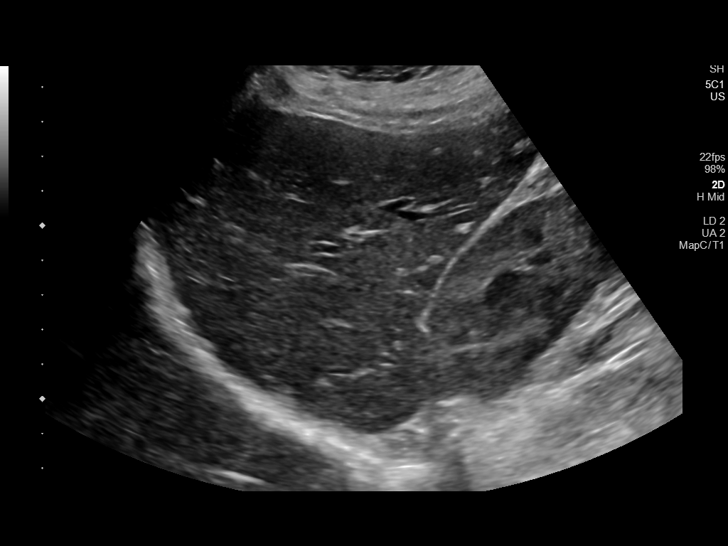
[im 30/65]
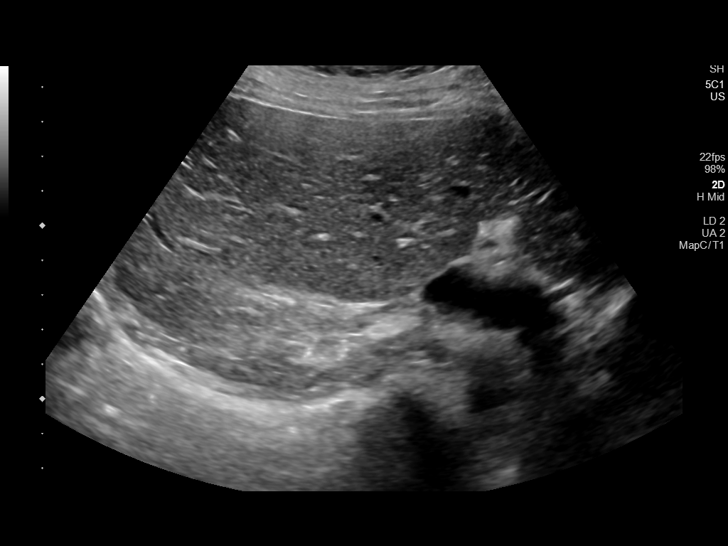
[im 35/65]
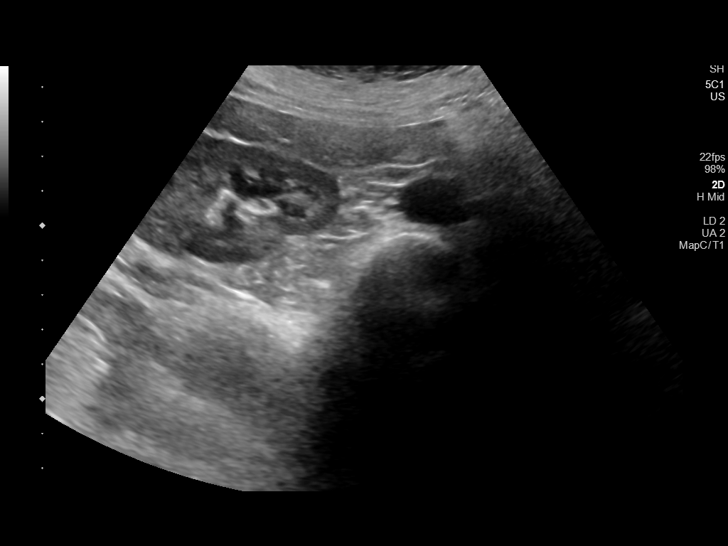
[im 41/65]
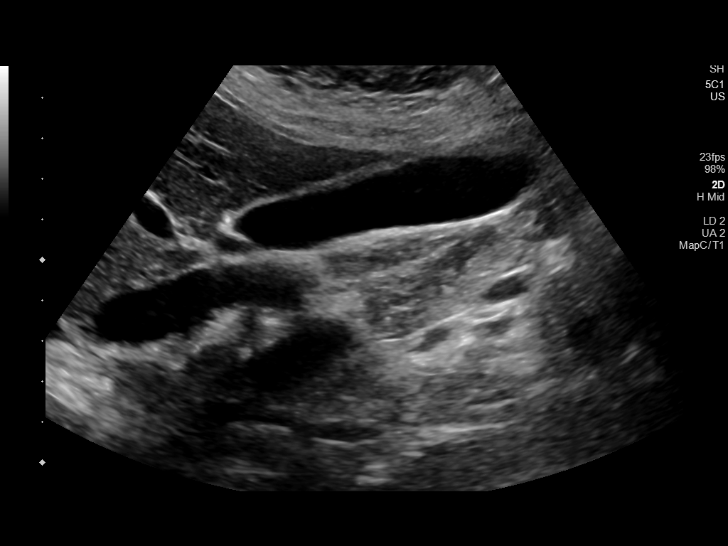
[im 43/65]
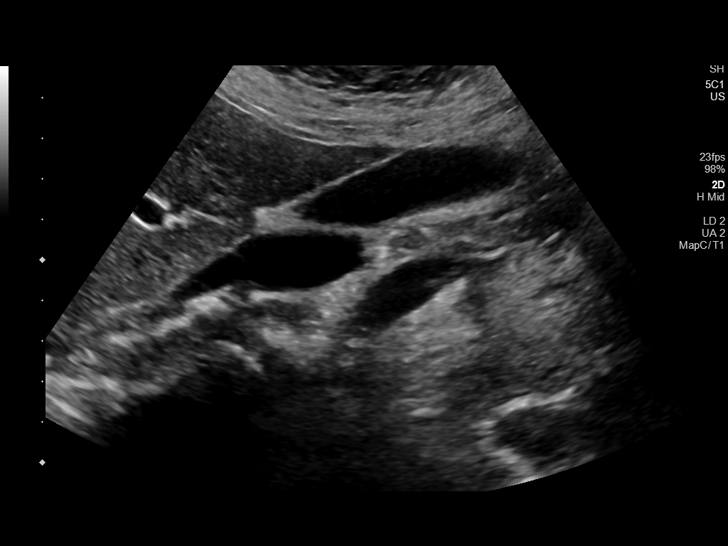
[im 49/65]
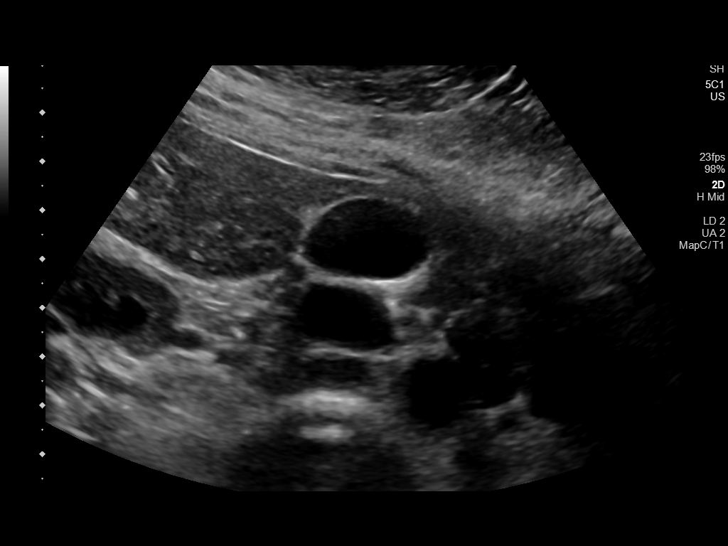
[im 54/65]
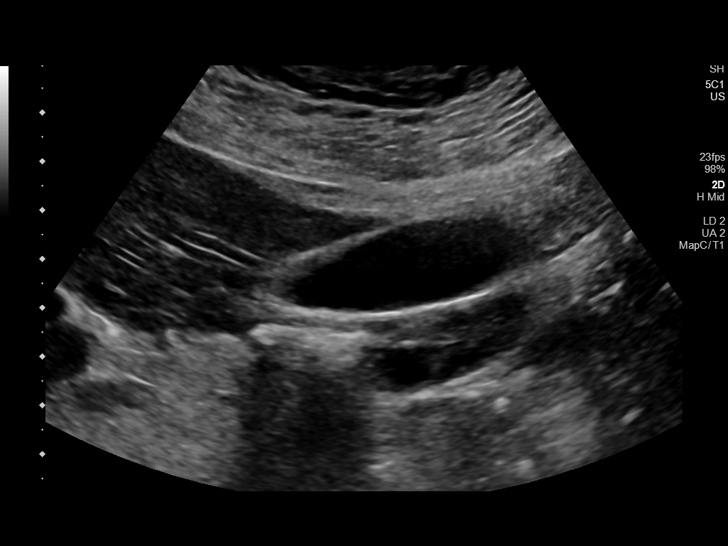
[im 59/65]
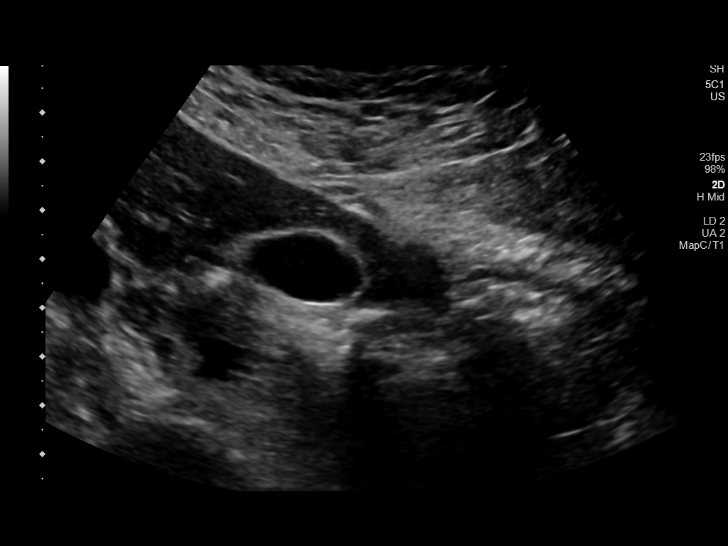
[im 65/65]
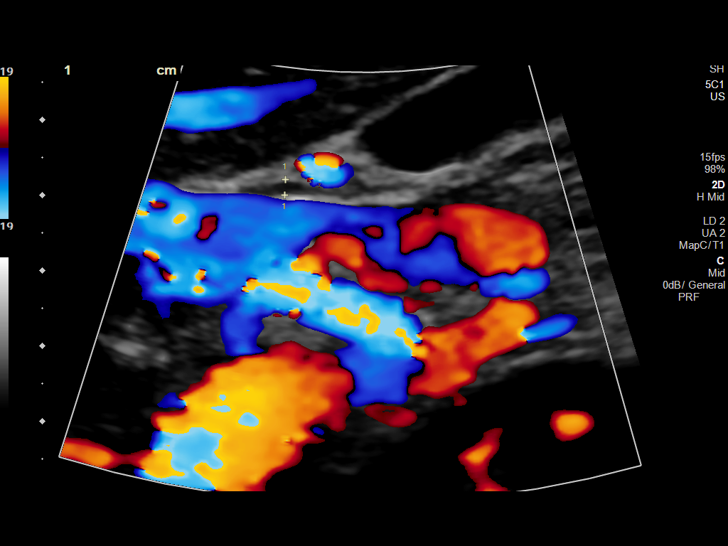

[14 of 25 positions shown; findings below may reference images not displayed]

FINDINGS: Gallbladder:

No gallstones or wall thickening visualized. No sonographic Murphy
sign noted by sonographer.

Common bile duct:

Diameter: 2 mm, within normal limits.

Liver:

No focal lesion identified. Within normal limits in parenchymal
echogenicity. Portal vein is patent on color Doppler imaging with
normal direction of blood flow towards the liver.

Other: None.
IMPRESSION: Negative. No hepatobiliary abnormality identified.

## 2022-07-13 DIAGNOSIS — Z20828 Contact with and (suspected) exposure to other viral communicable diseases: Secondary | ICD-10-CM | POA: Diagnosis not present

## 2022-07-16 DIAGNOSIS — F419 Anxiety disorder, unspecified: Secondary | ICD-10-CM | POA: Diagnosis not present

## 2022-07-16 DIAGNOSIS — Z91013 Allergy to seafood: Secondary | ICD-10-CM | POA: Diagnosis not present

## 2022-07-16 DIAGNOSIS — F4310 Post-traumatic stress disorder, unspecified: Secondary | ICD-10-CM | POA: Diagnosis not present

## 2022-07-16 DIAGNOSIS — K219 Gastro-esophageal reflux disease without esophagitis: Secondary | ICD-10-CM | POA: Diagnosis not present

## 2022-07-17 DIAGNOSIS — F411 Generalized anxiety disorder: Secondary | ICD-10-CM | POA: Diagnosis not present

## 2022-07-25 DIAGNOSIS — F4310 Post-traumatic stress disorder, unspecified: Secondary | ICD-10-CM | POA: Diagnosis not present

## 2022-08-01 DIAGNOSIS — F411 Generalized anxiety disorder: Secondary | ICD-10-CM | POA: Diagnosis not present

## 2022-08-04 DIAGNOSIS — R109 Unspecified abdominal pain: Secondary | ICD-10-CM | POA: Diagnosis not present

## 2022-08-04 DIAGNOSIS — R399 Unspecified symptoms and signs involving the genitourinary system: Secondary | ICD-10-CM | POA: Diagnosis not present

## 2022-08-04 DIAGNOSIS — Z6828 Body mass index (BMI) 28.0-28.9, adult: Secondary | ICD-10-CM | POA: Diagnosis not present

## 2022-08-08 DIAGNOSIS — F411 Generalized anxiety disorder: Secondary | ICD-10-CM | POA: Diagnosis not present

## 2022-08-15 DIAGNOSIS — Z20828 Contact with and (suspected) exposure to other viral communicable diseases: Secondary | ICD-10-CM | POA: Diagnosis not present

## 2022-08-22 DIAGNOSIS — F411 Generalized anxiety disorder: Secondary | ICD-10-CM | POA: Diagnosis not present

## 2022-08-29 DIAGNOSIS — F419 Anxiety disorder, unspecified: Secondary | ICD-10-CM | POA: Diagnosis not present

## 2022-08-29 DIAGNOSIS — F411 Generalized anxiety disorder: Secondary | ICD-10-CM | POA: Diagnosis not present

## 2022-08-29 DIAGNOSIS — F418 Other specified anxiety disorders: Secondary | ICD-10-CM | POA: Diagnosis not present

## 2022-08-29 DIAGNOSIS — F4310 Post-traumatic stress disorder, unspecified: Secondary | ICD-10-CM | POA: Diagnosis not present

## 2022-09-07 DIAGNOSIS — F411 Generalized anxiety disorder: Secondary | ICD-10-CM | POA: Diagnosis not present

## 2022-09-12 DIAGNOSIS — Z880 Allergy status to penicillin: Secondary | ICD-10-CM | POA: Diagnosis not present

## 2022-09-12 DIAGNOSIS — R1031 Right lower quadrant pain: Secondary | ICD-10-CM | POA: Diagnosis not present

## 2022-09-12 DIAGNOSIS — F419 Anxiety disorder, unspecified: Secondary | ICD-10-CM | POA: Diagnosis not present

## 2022-09-12 DIAGNOSIS — Z887 Allergy status to serum and vaccine status: Secondary | ICD-10-CM | POA: Diagnosis not present

## 2022-09-12 DIAGNOSIS — Z79899 Other long term (current) drug therapy: Secondary | ICD-10-CM | POA: Diagnosis not present

## 2022-09-12 DIAGNOSIS — R10813 Right lower quadrant abdominal tenderness: Secondary | ICD-10-CM | POA: Diagnosis not present

## 2022-09-12 DIAGNOSIS — R109 Unspecified abdominal pain: Secondary | ICD-10-CM | POA: Diagnosis not present

## 2022-09-12 DIAGNOSIS — Z881 Allergy status to other antibiotic agents status: Secondary | ICD-10-CM | POA: Diagnosis not present

## 2022-09-12 DIAGNOSIS — K219 Gastro-esophageal reflux disease without esophagitis: Secondary | ICD-10-CM | POA: Diagnosis not present

## 2022-09-17 DIAGNOSIS — F418 Other specified anxiety disorders: Secondary | ICD-10-CM | POA: Diagnosis not present

## 2022-09-17 DIAGNOSIS — N83201 Unspecified ovarian cyst, right side: Secondary | ICD-10-CM | POA: Diagnosis not present

## 2022-09-19 DIAGNOSIS — F411 Generalized anxiety disorder: Secondary | ICD-10-CM | POA: Diagnosis not present

## 2022-09-20 DIAGNOSIS — N83209 Unspecified ovarian cyst, unspecified side: Secondary | ICD-10-CM | POA: Diagnosis not present

## 2022-09-20 DIAGNOSIS — N83201 Unspecified ovarian cyst, right side: Secondary | ICD-10-CM | POA: Diagnosis not present

## 2022-09-20 DIAGNOSIS — Z880 Allergy status to penicillin: Secondary | ICD-10-CM | POA: Diagnosis not present

## 2022-09-20 DIAGNOSIS — R1031 Right lower quadrant pain: Secondary | ICD-10-CM | POA: Diagnosis not present

## 2022-10-03 DIAGNOSIS — F411 Generalized anxiety disorder: Secondary | ICD-10-CM | POA: Diagnosis not present

## 2022-10-09 DIAGNOSIS — F4323 Adjustment disorder with mixed anxiety and depressed mood: Secondary | ICD-10-CM | POA: Diagnosis not present

## 2022-10-12 DIAGNOSIS — N83209 Unspecified ovarian cyst, unspecified side: Secondary | ICD-10-CM | POA: Diagnosis not present

## 2022-10-12 DIAGNOSIS — N940 Mittelschmerz: Secondary | ICD-10-CM | POA: Diagnosis not present

## 2022-10-22 DIAGNOSIS — Z8742 Personal history of other diseases of the female genital tract: Secondary | ICD-10-CM | POA: Diagnosis not present

## 2022-10-22 DIAGNOSIS — F4310 Post-traumatic stress disorder, unspecified: Secondary | ICD-10-CM | POA: Diagnosis not present

## 2022-10-22 DIAGNOSIS — F418 Other specified anxiety disorders: Secondary | ICD-10-CM | POA: Diagnosis not present

## 2022-10-22 DIAGNOSIS — F419 Anxiety disorder, unspecified: Secondary | ICD-10-CM | POA: Diagnosis not present

## 2022-10-24 DIAGNOSIS — F411 Generalized anxiety disorder: Secondary | ICD-10-CM | POA: Diagnosis not present

## 2022-10-26 DIAGNOSIS — Z8742 Personal history of other diseases of the female genital tract: Secondary | ICD-10-CM | POA: Diagnosis not present

## 2022-10-26 DIAGNOSIS — Z309 Encounter for contraceptive management, unspecified: Secondary | ICD-10-CM | POA: Diagnosis not present

## 2022-10-30 DIAGNOSIS — N926 Irregular menstruation, unspecified: Secondary | ICD-10-CM | POA: Diagnosis not present

## 2022-10-30 DIAGNOSIS — E282 Polycystic ovarian syndrome: Secondary | ICD-10-CM | POA: Diagnosis not present

## 2022-11-09 DIAGNOSIS — F411 Generalized anxiety disorder: Secondary | ICD-10-CM | POA: Diagnosis not present

## 2022-11-09 DIAGNOSIS — Z3202 Encounter for pregnancy test, result negative: Secondary | ICD-10-CM | POA: Diagnosis not present

## 2022-11-09 DIAGNOSIS — Z30011 Encounter for initial prescription of contraceptive pills: Secondary | ICD-10-CM | POA: Diagnosis not present

## 2022-11-19 DIAGNOSIS — F411 Generalized anxiety disorder: Secondary | ICD-10-CM | POA: Diagnosis not present

## 2023-01-02 DIAGNOSIS — F411 Generalized anxiety disorder: Secondary | ICD-10-CM | POA: Diagnosis not present

## 2023-01-29 DIAGNOSIS — F411 Generalized anxiety disorder: Secondary | ICD-10-CM | POA: Diagnosis not present

## 2023-02-19 DIAGNOSIS — F411 Generalized anxiety disorder: Secondary | ICD-10-CM | POA: Diagnosis not present

## 2023-03-13 DIAGNOSIS — Z23 Encounter for immunization: Secondary | ICD-10-CM | POA: Diagnosis not present

## 2023-03-14 DIAGNOSIS — D485 Neoplasm of uncertain behavior of skin: Secondary | ICD-10-CM | POA: Diagnosis not present

## 2023-03-14 DIAGNOSIS — L700 Acne vulgaris: Secondary | ICD-10-CM | POA: Diagnosis not present

## 2023-03-14 DIAGNOSIS — D225 Melanocytic nevi of trunk: Secondary | ICD-10-CM | POA: Diagnosis not present

## 2023-03-14 DIAGNOSIS — F411 Generalized anxiety disorder: Secondary | ICD-10-CM | POA: Diagnosis not present

## 2023-03-14 DIAGNOSIS — L814 Other melanin hyperpigmentation: Secondary | ICD-10-CM | POA: Diagnosis not present

## 2023-03-22 DIAGNOSIS — Z3009 Encounter for other general counseling and advice on contraception: Secondary | ICD-10-CM | POA: Diagnosis not present

## 2023-03-25 DIAGNOSIS — Z1330 Encounter for screening examination for mental health and behavioral disorders, unspecified: Secondary | ICD-10-CM | POA: Diagnosis not present

## 2023-03-28 DIAGNOSIS — F411 Generalized anxiety disorder: Secondary | ICD-10-CM | POA: Diagnosis not present

## 2023-04-04 DIAGNOSIS — H0014 Chalazion left upper eyelid: Secondary | ICD-10-CM | POA: Diagnosis not present

## 2023-04-10 DIAGNOSIS — D485 Neoplasm of uncertain behavior of skin: Secondary | ICD-10-CM | POA: Diagnosis not present

## 2023-04-10 DIAGNOSIS — D2271 Melanocytic nevi of right lower limb, including hip: Secondary | ICD-10-CM | POA: Diagnosis not present

## 2023-04-12 DIAGNOSIS — F411 Generalized anxiety disorder: Secondary | ICD-10-CM | POA: Diagnosis not present

## 2023-04-12 DIAGNOSIS — Z111 Encounter for screening for respiratory tuberculosis: Secondary | ICD-10-CM | POA: Diagnosis not present

## 2023-04-12 DIAGNOSIS — Z0000 Encounter for general adult medical examination without abnormal findings: Secondary | ICD-10-CM | POA: Diagnosis not present

## 2023-04-12 DIAGNOSIS — F418 Other specified anxiety disorders: Secondary | ICD-10-CM | POA: Diagnosis not present

## 2023-04-12 DIAGNOSIS — E559 Vitamin D deficiency, unspecified: Secondary | ICD-10-CM | POA: Diagnosis not present

## 2023-04-12 DIAGNOSIS — Z13220 Encounter for screening for lipoid disorders: Secondary | ICD-10-CM | POA: Diagnosis not present

## 2023-04-12 DIAGNOSIS — Z79899 Other long term (current) drug therapy: Secondary | ICD-10-CM | POA: Diagnosis not present

## 2023-04-12 DIAGNOSIS — F4310 Post-traumatic stress disorder, unspecified: Secondary | ICD-10-CM | POA: Diagnosis not present

## 2023-04-14 DIAGNOSIS — T8149XA Infection following a procedure, other surgical site, initial encounter: Secondary | ICD-10-CM | POA: Diagnosis not present

## 2023-04-14 DIAGNOSIS — S81001A Unspecified open wound, right knee, initial encounter: Secondary | ICD-10-CM | POA: Diagnosis not present

## 2023-04-15 DIAGNOSIS — K219 Gastro-esophageal reflux disease without esophagitis: Secondary | ICD-10-CM | POA: Diagnosis not present

## 2023-04-15 DIAGNOSIS — R635 Abnormal weight gain: Secondary | ICD-10-CM | POA: Diagnosis not present

## 2023-04-17 DIAGNOSIS — F411 Generalized anxiety disorder: Secondary | ICD-10-CM | POA: Diagnosis not present

## 2023-05-13 DIAGNOSIS — F411 Generalized anxiety disorder: Secondary | ICD-10-CM | POA: Diagnosis not present

## 2023-05-27 NOTE — Telephone Encounter (Signed)
lvm for pt to ask [employer] if she will be covered for dr Angeline Slim appnt as her Brittney Clay is showing to be connected to partial HSN - left callback # for questions

## 2023-06-06 ENCOUNTER — Encounter: Payer: BLUE CROSS/BLUE SHIELD | Attending: Optometrist

## 2023-06-13 DIAGNOSIS — F411 Generalized anxiety disorder: Secondary | ICD-10-CM | POA: Diagnosis not present

## 2023-06-27 ENCOUNTER — Encounter: Payer: BLUE CROSS/BLUE SHIELD | Attending: Optometrist

## 2023-07-05 DIAGNOSIS — R197 Diarrhea, unspecified: Secondary | ICD-10-CM | POA: Diagnosis not present

## 2023-07-23 DIAGNOSIS — Z79899 Other long term (current) drug therapy: Secondary | ICD-10-CM | POA: Diagnosis not present

## 2023-07-23 DIAGNOSIS — R10817 Generalized abdominal tenderness: Secondary | ICD-10-CM | POA: Diagnosis not present

## 2023-07-23 DIAGNOSIS — Z5181 Encounter for therapeutic drug level monitoring: Secondary | ICD-10-CM | POA: Diagnosis not present

## 2023-07-23 DIAGNOSIS — R1084 Generalized abdominal pain: Secondary | ICD-10-CM | POA: Diagnosis not present

## 2023-07-23 DIAGNOSIS — R1013 Epigastric pain: Secondary | ICD-10-CM | POA: Diagnosis not present

## 2023-07-23 DIAGNOSIS — R197 Diarrhea, unspecified: Secondary | ICD-10-CM | POA: Diagnosis not present

## 2023-07-23 DIAGNOSIS — R110 Nausea: Secondary | ICD-10-CM | POA: Diagnosis not present

## 2023-07-25 DIAGNOSIS — F411 Generalized anxiety disorder: Secondary | ICD-10-CM | POA: Diagnosis not present

## 2023-07-26 DIAGNOSIS — R197 Diarrhea, unspecified: Secondary | ICD-10-CM | POA: Diagnosis not present

## 2023-08-12 DIAGNOSIS — R1084 Generalized abdominal pain: Secondary | ICD-10-CM | POA: Diagnosis not present

## 2023-08-12 DIAGNOSIS — R197 Diarrhea, unspecified: Secondary | ICD-10-CM | POA: Diagnosis not present

## 2023-08-29 DIAGNOSIS — F411 Generalized anxiety disorder: Secondary | ICD-10-CM | POA: Diagnosis not present

## 2023-10-10 DIAGNOSIS — F411 Generalized anxiety disorder: Secondary | ICD-10-CM | POA: Diagnosis not present

## 2023-10-28 DIAGNOSIS — F411 Generalized anxiety disorder: Secondary | ICD-10-CM | POA: Diagnosis not present

## 2023-11-14 DIAGNOSIS — F411 Generalized anxiety disorder: Secondary | ICD-10-CM | POA: Diagnosis not present

## 2023-12-22 DIAGNOSIS — B338 Other specified viral diseases: Secondary | ICD-10-CM | POA: Diagnosis not present

## 2023-12-22 DIAGNOSIS — J069 Acute upper respiratory infection, unspecified: Secondary | ICD-10-CM | POA: Diagnosis not present

## 2023-12-23 DIAGNOSIS — F411 Generalized anxiety disorder: Secondary | ICD-10-CM | POA: Diagnosis not present

## 2023-12-30 DIAGNOSIS — H00024 Hordeolum internum left upper eyelid: Secondary | ICD-10-CM | POA: Diagnosis not present

## 2023-12-30 DIAGNOSIS — R058 Other specified cough: Secondary | ICD-10-CM | POA: Diagnosis not present

## 2024-03-29 DIAGNOSIS — S6992XA Unspecified injury of left wrist, hand and finger(s), initial encounter: Secondary | ICD-10-CM | POA: Diagnosis not present

## 2024-03-31 DIAGNOSIS — F4310 Post-traumatic stress disorder, unspecified: Secondary | ICD-10-CM | POA: Diagnosis not present

## 2024-04-01 DIAGNOSIS — F418 Other specified anxiety disorders: Secondary | ICD-10-CM | POA: Diagnosis not present

## 2024-04-01 DIAGNOSIS — F419 Anxiety disorder, unspecified: Secondary | ICD-10-CM | POA: Diagnosis not present

## 2024-04-01 DIAGNOSIS — F909 Attention-deficit hyperactivity disorder, unspecified type: Secondary | ICD-10-CM | POA: Diagnosis not present

## 2024-04-01 DIAGNOSIS — F4310 Post-traumatic stress disorder, unspecified: Secondary | ICD-10-CM | POA: Diagnosis not present

## 2024-04-10 ENCOUNTER — Emergency Department (HOSPITAL_BASED_OUTPATIENT_CLINIC_OR_DEPARTMENT_OTHER)
Admission: EM | Admit: 2024-04-10 | Discharge: 2024-04-10 | Disposition: A | Discharge status: Home / Self Care | Attending: Emergency Medicine | Admitting: Emergency Medicine

## 2024-04-10 ENCOUNTER — Other Ambulatory Visit: Payer: Self-pay

## 2024-04-10 ENCOUNTER — Emergency Department (HOSPITAL_BASED_OUTPATIENT_CLINIC_OR_DEPARTMENT_OTHER)

## 2024-04-10 DIAGNOSIS — R1010 Upper abdominal pain, unspecified: Secondary | ICD-10-CM

## 2024-04-10 DIAGNOSIS — R1013 Epigastric pain: Secondary | ICD-10-CM | POA: Insufficient documentation

## 2024-04-10 DIAGNOSIS — R1011 Right upper quadrant pain: Secondary | ICD-10-CM | POA: Insufficient documentation

## 2024-04-10 DIAGNOSIS — R109 Unspecified abdominal pain: Secondary | ICD-10-CM | POA: Diagnosis not present

## 2024-04-10 DIAGNOSIS — R1012 Left upper quadrant pain: Secondary | ICD-10-CM | POA: Insufficient documentation

## 2024-04-10 DIAGNOSIS — R110 Nausea: Secondary | ICD-10-CM | POA: Diagnosis not present

## 2024-04-10 DIAGNOSIS — R197 Diarrhea, unspecified: Secondary | ICD-10-CM | POA: Diagnosis not present

## 2024-04-10 DIAGNOSIS — R9431 Abnormal electrocardiogram [ECG] [EKG]: Secondary | ICD-10-CM | POA: Diagnosis not present

## 2024-04-10 LAB — COMPREHENSIVE METABOLIC PANEL WITH GFR
ALT: 15 U/L (ref 0–44)
AST: 20 U/L (ref 15–41)
Albumin: 4.5 g/dL (ref 3.5–5.0)
Alkaline Phosphatase: 77 U/L (ref 38–126)
Anion gap: 12 (ref 5–15)
BUN: 11 mg/dL (ref 6–20)
CO2: 24 mmol/L (ref 22–32)
Calcium: 10.5 mg/dL — ABNORMAL HIGH (ref 8.9–10.3)
Chloride: 100 mmol/L (ref 98–111)
Creatinine, Ser: 0.77 mg/dL (ref 0.44–1.00)
GFR, Estimated: >60 mL/min (ref >60)
Glucose, Bld: 87 mg/dL (ref 70–99)
Potassium: 4.2 mmol/L (ref 3.5–5.1)
Sodium: 136 mmol/L (ref 135–145)
Total Bilirubin: 0.3 mg/dL (ref 0.0–1.2)
Total Protein: 7.5 g/dL (ref 6.5–8.1)

## 2024-04-10 LAB — URINALYSIS, ROUTINE W REFLEX MICROSCOPIC
Bilirubin Urine: NEGATIVE
Glucose, UA: NEGATIVE mg/dL
Hgb urine dipstick: NEGATIVE
Ketones, ur: NEGATIVE mg/dL
Leukocytes,Ua: NEGATIVE
Nitrite: NEGATIVE
Protein, ur: NEGATIVE mg/dL
Specific Gravity, Urine: 1.009 (ref 1.005–1.030)
pH: 6 (ref 5.0–8.0)

## 2024-04-10 LAB — CBC
HCT: 36.2 % (ref 36.0–46.0)
Hemoglobin: 12.3 g/dL (ref 12.0–15.0)
MCH: 29.6 pg (ref 26.0–34.0)
MCHC: 34 g/dL (ref 30.0–36.0)
MCV: 87 fL (ref 80.0–100.0)
Platelets: 285 10*3/uL (ref 150–400)
RBC: 4.16 MIL/uL (ref 3.87–5.11)
RDW: 12.6 % (ref 11.5–15.5)
WBC: 6.8 10*3/uL (ref 4.0–10.5)
nRBC: 0 % (ref 0.0–0.2)

## 2024-04-10 LAB — PREGNANCY, URINE: Preg Test, Ur: NEGATIVE

## 2024-04-10 LAB — LIPASE, BLOOD: Lipase: 31 U/L (ref 11–51)

## 2024-04-10 MED ORDER — IOHEXOL 300 MG/ML  SOLN
100.0000 mL | Freq: Once | INTRAMUSCULAR | Status: AC | PRN
  Administered 2024-04-10: 100 mL via INTRAVENOUS

## 2024-04-10 NOTE — ED Triage Notes (Signed)
 Pt POV reporting upper abd pain that began Wed evening, hurts to take a deep breath, improves when lying down. Also reports diarrhea x1 month, appt scheduled with GI, hx cdiff.

## 2024-04-10 NOTE — ED Provider Notes (Signed)
 [place] EMERGENCY DEPARTMENT AT [facility] Provider Note   CSN: [id number] Arrival date & time: 04/10/24  1947     History  Chief Complaint  Patient presents with   Abdominal Pain    Leslie Thompson is a 38 y.o. female.  Patient to ED with symptoms progressively worsening bilateral upper abdominal discomfort that radiates into the back. Symptoms started 2 days ago and have become more constant and severe. Nausea without vomiting. She reports longstanding diarrhea that is nonbloody and unchanged tonight. No fever. No urinary symptoms.   The history is provided by the patient. No language interpreter was used.  Abdominal Pain      Home Medications Prior to Admission medications   Medication Sig Start Date End Date Taking? Authorizing Provider  albuterol (PROVENTIL HFA;VENTOLIN HFA) 108 (90 BASE) MCG/ACT inhaler Inhale 2 puffs into the lungs every 6 (six) hours as needed for wheezing.    [provider]  ciprofloxacin  (CIPRO ) 500 MG tablet Take 1 tablet (500 mg total) by mouth 2 (two) times daily. One po bid x 7 days 06/03/21   Alissa April, MD  EPINEPHrine  0.3 mg/0.3 mL IJ SOAJ injection Inject 0.3 mLs (0.3 mg total) into the muscle as needed for anaphylaxis. 03/04/20   Layden, Lindsey A, PA-C  famotidine  (PEPCID ) 20 MG tablet Take 1 tablet (20 mg total) by mouth 2 (two) times daily. 03/11/20   Ethlyn Herd, MD  hydrOXYzine  (ATARAX /VISTARIL ) 25 MG tablet Take 1 tablet (25 mg total) by mouth every 6 (six) hours as needed for itching. 03/11/20   Mortenson, Ashley, MD  loratadine (CLARITIN) 10 MG tablet Take 10 mg by mouth daily.    [provider]  pantoprazole  (PROTONIX ) 20 MG tablet Take 1 tablet (20 mg total) by mouth daily. 03/11/20   Ethlyn Herd, MD  diphenhydrAMINE  (BENADRYL ) 25 MG tablet Take 1 tablet (25 mg total) by mouth every 6 (six) hours as needed for up to 5 days. 03/04/20 03/11/20  Layden, Lindsey A, PA-C      Allergies    Azithromycin,  Pfizer-biontech covid-19 vacc [covid-19 mrna vacc (moderna)], Red dye #2 (amaranth), Penicillins, and Naproxen    Review of Systems   Review of Systems  Gastrointestinal:  Positive for abdominal pain.    Physical Exam Updated Vital Signs BP 130/77   Pulse 76   Temp 98.4 F (36.9 C) (Oral)   Resp 18   Ht 6\' 3"  (1.905 m)   Wt 97.1 kg   LMP 03/26/2024 (Approximate)   SpO2 99%   BMI 26.75 kg/m  Physical Exam Constitutional:      Appearance: She is well-developed.  HENT:     Head: Normocephalic.  Cardiovascular:     Rate and Rhythm: Normal rate and regular rhythm.     Heart sounds: No murmur heard. Pulmonary:     Effort: Pulmonary effort is normal.     Breath sounds: Normal breath sounds. No wheezing, rhonchi or rales.  Abdominal:     General: Bowel sounds are normal. There is no distension.     Palpations: Abdomen is soft.     Tenderness: There is abdominal tenderness in the right upper quadrant, epigastric area and left upper quadrant. There is no guarding or rebound.  Musculoskeletal:        General: Normal range of motion.     Cervical back: Normal range of motion and neck supple.  Skin:    General: Skin is warm and dry.  Neurological:  General: No focal deficit present.     Mental Status: She is alert and oriented to person, place, and time.     ED Results / Procedures / Treatments   Labs (all labs ordered are listed, but only abnormal results are displayed) Labs Reviewed  COMPREHENSIVE METABOLIC PANEL WITH GFR - Abnormal; Notable for the following components:      Result Value   Calcium 10.5 (*)    All other components within normal limits  URINALYSIS, ROUTINE W REFLEX MICROSCOPIC - Abnormal; Notable for the following components:   Color, Urine COLORLESS (*)    Bacteria, UA FEW (*)    All other components within normal limits  LIPASE, BLOOD  CBC  PREGNANCY, URINE    EKG None  Radiology CT ABDOMEN PELVIS W CONTRAST Result Date: 04/10/2024 CLINICAL  DATA:  Abdominal pain. EXAM: CT ABDOMEN AND PELVIS WITH CONTRAST TECHNIQUE: Multidetector CT imaging of the abdomen and pelvis was performed using the standard protocol following bolus administration of intravenous contrast. RADIATION DOSE REDUCTION: This exam was performed according to the departmental dose-optimization program which includes automated exposure control, adjustment of the mA and/or kV according to patient size and/or use of iterative reconstruction technique. CONTRAST:  OMNIPAQUE  IOHEXOL  300 MG/ML  SOLN COMPARISON:  CT abdomen pelvis dated 06/03/2021. FINDINGS: Lower chest: The visualized lung bases are clear. No intra-abdominal free air or free fluid. Hepatobiliary: No focal liver abnormality is seen. No gallstones, gallbladder wall thickening, or biliary dilatation. Pancreas: Unremarkable. No pancreatic ductal dilatation or surrounding inflammatory changes. Spleen: Normal in size without focal abnormality. Adrenals/Urinary Tract: The adrenal glands unremarkable there is no hydronephrosis on either side. The visualized ureters and urinary bladder appear unremarkable. Stomach/Bowel: There is no bowel obstruction or active inflammation. The appendix is normal. Vascular/Lymphatic: The abdominal aorta and IVC unremarkable. No portal venous gas. There is no adenopathy. Reproductive: The uterus is anteverted. No suspicious adnexal masses. Other: None Musculoskeletal: No acute or significant osseous findings. IMPRESSION: No acute intra-abdominal or pelvic pathology. Electronically Signed   By: Angus Bark M.D.   On: 04/10/2024 21:42    Procedures Procedures    Medications Ordered in ED Medications  iohexol  (OMNIPAQUE ) 300 MG/ML solution 100 mL (100 mLs Intravenous Contrast Given 04/10/24 2133)    ED Course/ Medical Decision Making/ A&P Clinical Course as of 04/10/24 2235  Fri Apr 10, 2024  2219 Patient with upper abdominal pain x 2 days without fever. Labs reassuring - no  leukocytosis, anemia, LFT abnormality, neg preg, neg UA. VSS, afebrile. CT abd/pel negative for acute finding. She is appropriate for discharge home. Encouraged to keep pending appointment with GI. Patient has declined any medications for pain in ED. Will offer to prescribe Bentyl for symptomatic relief.  [SU]    Clinical Course User Index [SU] Mandy Second, PA-C                                 Medical Decision Making Amount and/or Complexity of Data Reviewed Labs: ordered. Radiology: ordered.  Risk Prescription drug management.           Final Clinical Impression(s) / ED Diagnoses Final diagnoses:  Pain of upper abdomen    Rx / DC Orders ED Discharge Orders     None         Mandy Second, PA-C 04/10/24 2235    Afton Horse T, DO 04/11/24 1452

## 2024-04-10 NOTE — Discharge Instructions (Signed)
 As we discussed, your work up today in the ED is negative for any concerning findings. Please keep your scheduled follow up with your gastroenterologist.   Return to the ED with any new concerns at any time.

## 2024-04-13 DIAGNOSIS — R197 Diarrhea, unspecified: Secondary | ICD-10-CM | POA: Diagnosis not present

## 2024-04-15 DIAGNOSIS — K219 Gastro-esophageal reflux disease without esophagitis: Secondary | ICD-10-CM | POA: Diagnosis not present

## 2024-04-15 DIAGNOSIS — R109 Unspecified abdominal pain: Secondary | ICD-10-CM | POA: Diagnosis not present

## 2024-04-15 DIAGNOSIS — R197 Diarrhea, unspecified: Secondary | ICD-10-CM | POA: Diagnosis not present

## 2024-04-29 DIAGNOSIS — L710 Perioral dermatitis: Secondary | ICD-10-CM | POA: Diagnosis not present

## 2024-05-06 DIAGNOSIS — E669 Obesity, unspecified: Secondary | ICD-10-CM | POA: Diagnosis not present

## 2024-05-06 DIAGNOSIS — F419 Anxiety disorder, unspecified: Secondary | ICD-10-CM | POA: Diagnosis not present

## 2024-05-06 DIAGNOSIS — Z91013 Allergy to seafood: Secondary | ICD-10-CM | POA: Diagnosis not present

## 2024-05-06 DIAGNOSIS — F909 Attention-deficit hyperactivity disorder, unspecified type: Secondary | ICD-10-CM | POA: Diagnosis not present

## 2024-05-07 DIAGNOSIS — Z111 Encounter for screening for respiratory tuberculosis: Secondary | ICD-10-CM | POA: Diagnosis not present

## 2024-05-14 DIAGNOSIS — H6121 Impacted cerumen, right ear: Secondary | ICD-10-CM | POA: Diagnosis not present

## 2024-05-14 DIAGNOSIS — Z0000 Encounter for general adult medical examination without abnormal findings: Secondary | ICD-10-CM | POA: Diagnosis not present

## 2024-05-14 DIAGNOSIS — Z13220 Encounter for screening for lipoid disorders: Secondary | ICD-10-CM | POA: Diagnosis not present

## 2024-05-15 ENCOUNTER — Other Ambulatory Visit: Payer: Self-pay | Admitting: Family Medicine

## 2024-05-15 DIAGNOSIS — Z0000 Encounter for general adult medical examination without abnormal findings: Secondary | ICD-10-CM | POA: Diagnosis not present

## 2024-05-15 DIAGNOSIS — Z13220 Encounter for screening for lipoid disorders: Secondary | ICD-10-CM | POA: Diagnosis not present

## 2024-05-15 DIAGNOSIS — N63 Unspecified lump in unspecified breast: Secondary | ICD-10-CM

## 2024-05-20 ENCOUNTER — Other Ambulatory Visit: Payer: Self-pay | Admitting: Family Medicine

## 2024-05-20 ENCOUNTER — Inpatient Hospital Stay
Admission: RE | Admit: 2024-05-20 | Discharge: 2024-05-20 | Discharge status: Home / Self Care | Source: Ambulatory Visit | Attending: Family Medicine

## 2024-05-20 ENCOUNTER — Ambulatory Visit (HOSPITAL_COMMUNITY)
Admission: EM | Admit: 2024-05-20 | Discharge: 2024-05-20 | Disposition: A | Discharge status: Home / Self Care | Attending: Physician Assistant | Admitting: Physician Assistant

## 2024-05-20 ENCOUNTER — Ambulatory Visit
Admission: RE | Admit: 2024-05-20 | Discharge: 2024-05-20 | Disposition: A | Discharge status: Home / Self Care | Source: Ambulatory Visit | Attending: Family Medicine | Admitting: Family Medicine

## 2024-05-20 ENCOUNTER — Encounter (HOSPITAL_COMMUNITY): Payer: Self-pay | Admitting: *Deleted

## 2024-05-20 ENCOUNTER — Ambulatory Visit (INDEPENDENT_AMBULATORY_CARE_PROVIDER_SITE_OTHER)

## 2024-05-20 DIAGNOSIS — N63 Unspecified lump in unspecified breast: Secondary | ICD-10-CM

## 2024-05-20 DIAGNOSIS — N6322 Unspecified lump in the left breast, upper inner quadrant: Secondary | ICD-10-CM | POA: Diagnosis not present

## 2024-05-20 DIAGNOSIS — S63502A Unspecified sprain of left wrist, initial encounter: Secondary | ICD-10-CM

## 2024-05-20 DIAGNOSIS — W19XXXA Unspecified fall, initial encounter: Secondary | ICD-10-CM

## 2024-05-20 DIAGNOSIS — N6315 Unspecified lump in the right breast, overlapping quadrants: Secondary | ICD-10-CM | POA: Diagnosis not present

## 2024-05-20 DIAGNOSIS — N6320 Unspecified lump in the left breast, unspecified quadrant: Secondary | ICD-10-CM

## 2024-05-20 DIAGNOSIS — Z043 Encounter for examination and observation following other accident: Secondary | ICD-10-CM | POA: Diagnosis not present

## 2024-05-20 DIAGNOSIS — N6310 Unspecified lump in the right breast, unspecified quadrant: Secondary | ICD-10-CM

## 2024-05-20 HISTORY — DX: Gastro-esophageal reflux disease without esophagitis: K21.9

## 2024-05-20 MED ORDER — DICLOFENAC SODIUM 1 % EX GEL: 2.0000 g | Freq: Four times a day (QID) | CUTANEOUS | 0 refills | Status: AC

## 2024-05-20 MED ORDER — TRAMADOL HCL 50 MG PO TABS: 50.0000 mg | ORAL_TABLET | Freq: Two times a day (BID) | ORAL | 0 refills | Status: AC | PRN

## 2024-05-20 NOTE — Discharge Instructions (Signed)
 Your x-ray was normal with no evidence of a broken bone which is great news.  I suspect you have injured some of the soft tissue in your wrist (sprain).  Use the brace for comfort and support.  After a week, you can start to increase your activity as you can tolerate it.  If your symptoms are not improving follow-up with  orthopedics; call to schedule an appointment.  Apply Voltaren  gel for pain relief.  I have also given you a few doses of tramadol .  This can make you sleepy so do not drive or drink alcohol with taking it.  This medication is addictive so try to limit use is much as possible.  Keep your wrist elevated and apply ice.  If anything worsens you have increasing pain, difficulty moving it, numbness or tingling in the hand, discoloration of the hand you need to be seen immediately.

## 2024-05-20 NOTE — ED Provider Notes (Signed)
 [facility]    CSN: [id number] Arrival date & time: 05/20/24  1927      History   Chief Complaint Chief Complaint  Patient presents with   Fall   Wrist Pain    HPI Leslie Thompson is a 38 y.o. female.   Patient presents today for evaluation of left wrist pain following injury.  Reports that she was at her parents house when there dog bolted towards the door and she ran to try to stop them from getting out of the door and slipped on a rug and fell forward onto an outstretched hand.  She has had ongoing pain since that time which is currently rated 4 on a 0-10 pain scale, described as aching, worse with certain movements, no alleviating factors identified.  She does report injuring this in the past but has never had any surgery.  She is right-handed.  Denies any numbness or paresthesias.  She has been applying ice which has provided some improvement of symptoms.  She has no concern for pregnancy.  She is confident she did not hit her head during the fall.    Past Medical History:  Diagnosis Date   Anxiety    Asthma    Depression    GERD (gastroesophageal reflux disease)     Patient Active Problem List   Diagnosis Date Noted   DEPRESSION 06/18/2007   Asthma 06/18/2007   SEIZURE DISORDER 06/18/2007    Past Surgical History:  Procedure Laterality Date   UPPER GI ENDOSCOPY     WISDOM TOOTH EXTRACTION      OB History   No obstetric history on file.      Home Medications    Prior to Admission medications   Medication Sig Start Date End Date Taking? Authorizing Provider  diclofenac  Sodium (VOLTAREN  ARTHRITIS PAIN) 1 % GEL Apply 2 g topically 4 (four) times daily. 05/20/24  Yes Susana Gripp K, PA-C  dimenhyDRINATE (DRAMAMINE PO) Take by mouth. Prn   Yes [provider]  escitalopram (LEXAPRO) 5 MG tablet Take 5 mg by mouth daily.   Yes [provider]  famotidine  (PEPCID ) 20 MG tablet Take 1 tablet (20 mg total) by mouth 2 (two) times daily.  03/11/20  Yes Van Knee, MD  lisdexamfetamine (VYVANSE) 20 MG capsule Take 20 mg by mouth daily.   Yes [provider]  ONDANSETRON  HCL PO Take by mouth.   Yes [provider]  pantoprazole  (PROTONIX ) 20 MG tablet Take 1 tablet (20 mg total) by mouth daily. Patient taking differently: Take 40 mg by mouth 2 (two) times daily. 03/11/20  Yes Van Knee, MD  PROPRANOLOL HCL PO Take by mouth.   Yes [provider]  traMADol  (ULTRAM ) 50 MG tablet Take 1 tablet (50 mg total) by mouth every 12 (twelve) hours as needed for up to 3 days. 05/20/24 05/23/24 Yes Yelitza Reach K, PA-C  albuterol (PROVENTIL HFA;VENTOLIN HFA) 108 (90 BASE) MCG/ACT inhaler Inhale 2 puffs into the lungs every 6 (six) hours as needed for wheezing.    [provider]  EPINEPHrine  0.3 mg/0.3 mL IJ SOAJ injection Inject 0.3 mLs (0.3 mg total) into the muscle as needed for anaphylaxis. 03/04/20   Layden, Lindsey A, PA-C  loratadine (CLARITIN) 10 MG tablet Take 10 mg by mouth daily.    [provider]  diphenhydrAMINE  (BENADRYL ) 25 MG tablet Take 1 tablet (25 mg total) by mouth every 6 (six) hours as needed for up to 5 days. 03/04/20 03/11/20  Layden, Lindsey A, PA-C    Family History Family History  Problem Relation Age of Onset   High Cholesterol Mother    High Cholesterol Father    Ovarian cancer Maternal Grandmother 46    Social History Social History   Tobacco Use   Smoking status: Never   Smokeless tobacco: Never  Vaping Use   Vaping status: Never Used  Substance Use Topics   Alcohol use: Not Currently   Drug use: No     Allergies   Azithromycin, Pfizer-biontech covid-19 vacc [covid-19 mrna vacc (moderna)], Red dye #2 (amaranth) (non-screening), Penicillins, and Naproxen   Review of Systems Review of Systems  Constitutional:  Positive for activity change. Negative for appetite change, fatigue and fever.  Musculoskeletal:  Positive for arthralgias. Negative for  myalgias.  Skin:  Negative for color change and wound.  Neurological:  Negative for weakness and numbness.     Physical Exam Triage Vital Signs ED Triage Vitals  Encounter Vitals Group     BP 05/20/24 1941 118/74     Girls Systolic BP Percentile --      Girls Diastolic BP Percentile --      Boys Systolic BP Percentile --      Boys Diastolic BP Percentile --      Pulse Rate 05/20/24 1941 80     Resp 05/20/24 1941 18     Temp 05/20/24 1937 97.9 F (36.6 C)     Temp Source 05/20/24 1937 Oral     SpO2 05/20/24 1941 97 %     Weight --      Height --      Head Circumference --      Peak Flow --      Pain Score 05/20/24 1940 4     Pain Loc --      Pain Education --      Exclude from Growth Chart --    No data found.  Updated Vital Signs BP 118/74   Pulse 80   Temp 97.9 F (36.6 C) (Oral)   Resp 18   LMP 04/24/2024 (Approximate)   SpO2 97%   Visual Acuity Right Eye Distance:   Left Eye Distance:   Bilateral Distance:    Right Eye Near:   Left Eye Near:    Bilateral Near:     Physical Exam Vitals reviewed.  Constitutional:      General: She is awake. She is not in acute distress.    Appearance: Normal appearance. She is well-developed. She is not ill-appearing.     Comments: Very pleasant female appears stated age in no acute distress sitting comfortable in exam room  HENT:     Head: Normocephalic and atraumatic.  Cardiovascular:     Rate and Rhythm: Normal rate and regular rhythm.     Pulses:          Radial pulses are 2+ on the left side.     Heart sounds: Normal heart sounds, S1 normal and S2 normal. No murmur heard.    Comments: Capillary refill within 2 seconds left fingers Pulmonary:     Effort: Pulmonary effort is normal.     Breath sounds: Normal breath sounds. No wheezing, rhonchi or rales.     Comments: Clear to auscultation bilaterally Musculoskeletal:     Left wrist: Tenderness present. No swelling, bony tenderness or snuff box tenderness.  Decreased range of motion.     Left hand: No swelling. Normal range of motion. There is no disruption of  two-point discrimination. Normal capillary refill.  Psychiatric:        Behavior: Behavior is cooperative.      UC Treatments / Results  Labs (all labs ordered are listed, but only abnormal results are displayed) Labs Reviewed - No data to display  EKG   Radiology DG Wrist Complete Left Result Date: 05/20/2024 CLINICAL DATA:  Fall EXAM: LEFT WRIST - COMPLETE 3+ VIEW COMPARISON:  None Available. FINDINGS: There is no evidence of fracture or dislocation. There is no evidence of arthropathy or other focal bone abnormality. Soft tissues are unremarkable. IMPRESSION: Negative. Electronically Signed   By: Greig Pique M.D.   On: 05/20/2024 20:12   MM 3D DIAGNOSTIC MAMMOGRAM BILATERAL BREAST Result Date: 05/20/2024 CLINICAL DATA:  Patient's referring clinician reports a lump in the right breast described as at 5 o'clock. Patient does not feel this. EXAM: DIGITAL DIAGNOSTIC BILATERAL MAMMOGRAM WITH TOMOSYNTHESIS AND CAD; ULTRASOUND LEFT BREAST LIMITED; ULTRASOUND RIGHT BREAST LIMITED TECHNIQUE: Bilateral digital diagnostic mammography and breast tomosynthesis was performed. The images were evaluated with computer-aided detection. ; Targeted ultrasound examination of the left breast was performed.; Targeted ultrasound examination of the right breast was performed COMPARISON:  None.  Baseline exam. ACR Breast Density Category b: There are scattered areas of fibroglandular density. FINDINGS: In the medial right breast, there are 2 adjacent oval mostly circumscribed masses, only well-defined on the cc tomosynthesis images, both measuring approximately 1 cm in long axis. In the left breast, upper inner quadrant, there is a small mostly circumscribed mass measuring 5 mm in long axis. There are no other breast masses, areas of significant asymmetry, areas of architectural distortion or suspicious  calcifications. On physical exam, no mass is palpated in the lower inner right breast. Targeted right breast ultrasound is performed, showing a bilobed, circumscribed hypoechoic mass projecting at 3 o'clock, 4 cm the nipple, measuring 1.1 cm longest dimension. This corresponds to what appear to be 2 adjacent masses on mammography. No abnormality is noted in the right breast at 5 o'clock. Targeted left breast ultrasound is performed, showing a small hypoechoic circumscribed mass at 10 o'clock, 4 cm the nipple, measuring 6 x 2 x 6 mm, consistent in size, shape and location to the upper inner quadrant mammographic mass. IMPRESSION: 1. Probably benign masses in each breast, on the right, a 1.1 cm bilobed mass at 3 o'clock and on the left a 6 mm oval mass at 10 o'clock. These are both consistent with fibroadenomas. Short-term follow-up recommended. RECOMMENDATION: 1. Bilateral breast ultrasound in 6 months to reassess the probably benign masses. I have discussed the findings and recommendations with the patient. If applicable, a reminder letter will be sent to the patient regarding the next appointment. BI-RADS CATEGORY  3: Probably benign. Electronically Signed   By: Alm Parkins M.D.   On: 05/20/2024 16:18   US  LIMITED ULTRASOUND INCLUDING AXILLA RIGHT BREAST Result Date: 05/20/2024 CLINICAL DATA:  Patient's referring clinician reports a lump in the right breast described as at 5 o'clock. Patient does not feel this. EXAM: DIGITAL DIAGNOSTIC BILATERAL MAMMOGRAM WITH TOMOSYNTHESIS AND CAD; ULTRASOUND LEFT BREAST LIMITED; ULTRASOUND RIGHT BREAST LIMITED TECHNIQUE: Bilateral digital diagnostic mammography and breast tomosynthesis was performed. The images were evaluated with computer-aided detection. ; Targeted ultrasound examination of the left breast was performed.; Targeted ultrasound examination of the right breast was performed COMPARISON:  None.  Baseline exam. ACR Breast Density Category b: There are scattered  areas of fibroglandular density. FINDINGS: In the medial right breast, there are  2 adjacent oval mostly circumscribed masses, only well-defined on the cc tomosynthesis images, both measuring approximately 1 cm in long axis. In the left breast, upper inner quadrant, there is a small mostly circumscribed mass measuring 5 mm in long axis. There are no other breast masses, areas of significant asymmetry, areas of architectural distortion or suspicious calcifications. On physical exam, no mass is palpated in the lower inner right breast. Targeted right breast ultrasound is performed, showing a bilobed, circumscribed hypoechoic mass projecting at 3 o'clock, 4 cm the nipple, measuring 1.1 cm longest dimension. This corresponds to what appear to be 2 adjacent masses on mammography. No abnormality is noted in the right breast at 5 o'clock. Targeted left breast ultrasound is performed, showing a small hypoechoic circumscribed mass at 10 o'clock, 4 cm the nipple, measuring 6 x 2 x 6 mm, consistent in size, shape and location to the upper inner quadrant mammographic mass. IMPRESSION: 1. Probably benign masses in each breast, on the right, a 1.1 cm bilobed mass at 3 o'clock and on the left a 6 mm oval mass at 10 o'clock. These are both consistent with fibroadenomas. Short-term follow-up recommended. RECOMMENDATION: 1. Bilateral breast ultrasound in 6 months to reassess the probably benign masses. I have discussed the findings and recommendations with the patient. If applicable, a reminder letter will be sent to the patient regarding the next appointment. BI-RADS CATEGORY  3: Probably benign. Electronically Signed   By: Alm Parkins M.D.   On: 05/20/2024 16:18   US  LIMITED ULTRASOUND INCLUDING AXILLA LEFT BREAST  Result Date: 05/20/2024 CLINICAL DATA:  Patient's referring clinician reports a lump in the right breast described as at 5 o'clock. Patient does not feel this. EXAM: DIGITAL DIAGNOSTIC BILATERAL MAMMOGRAM WITH  TOMOSYNTHESIS AND CAD; ULTRASOUND LEFT BREAST LIMITED; ULTRASOUND RIGHT BREAST LIMITED TECHNIQUE: Bilateral digital diagnostic mammography and breast tomosynthesis was performed. The images were evaluated with computer-aided detection. ; Targeted ultrasound examination of the left breast was performed.; Targeted ultrasound examination of the right breast was performed COMPARISON:  None.  Baseline exam. ACR Breast Density Category b: There are scattered areas of fibroglandular density. FINDINGS: In the medial right breast, there are 2 adjacent oval mostly circumscribed masses, only well-defined on the cc tomosynthesis images, both measuring approximately 1 cm in long axis. In the left breast, upper inner quadrant, there is a small mostly circumscribed mass measuring 5 mm in long axis. There are no other breast masses, areas of significant asymmetry, areas of architectural distortion or suspicious calcifications. On physical exam, no mass is palpated in the lower inner right breast. Targeted right breast ultrasound is performed, showing a bilobed, circumscribed hypoechoic mass projecting at 3 o'clock, 4 cm the nipple, measuring 1.1 cm longest dimension. This corresponds to what appear to be 2 adjacent masses on mammography. No abnormality is noted in the right breast at 5 o'clock. Targeted left breast ultrasound is performed, showing a small hypoechoic circumscribed mass at 10 o'clock, 4 cm the nipple, measuring 6 x 2 x 6 mm, consistent in size, shape and location to the upper inner quadrant mammographic mass. IMPRESSION: 1. Probably benign masses in each breast, on the right, a 1.1 cm bilobed mass at 3 o'clock and on the left a 6 mm oval mass at 10 o'clock. These are both consistent with fibroadenomas. Short-term follow-up recommended. RECOMMENDATION: 1. Bilateral breast ultrasound in 6 months to reassess the probably benign masses. I have discussed the findings and recommendations with the patient. If applicable, a  reminder  letter will be sent to the patient regarding the next appointment. BI-RADS CATEGORY  3: Probably benign. Electronically Signed   By: Alm Parkins M.D.   On: 05/20/2024 16:18    Procedures Procedures (including critical care time)  Medications Ordered in UC Medications - No data to display  Initial Impression / Assessment and Plan / UC Course  I have reviewed the triage vital signs and the nursing notes.  Pertinent labs & imaging results that were available during my care of the patient were reviewed by me and considered in my medical decision making (see chart for details).     Patient is well-appearing, afebrile, nontoxic, nontachycardic.  X-ray was obtained given mechanism of injury that showed no acute osseous abnormality.  Suspect sprain as etiology of symptoms.  She does not have any snuffbox tenderness.  Originally discussed the potential utility of anti-inflammatory medication but she reports that she has a history of GERD and typically does not tolerate oral formulations of this medicine.  We will use topical Voltaren  and she was given a few doses of tramadol  to help manage her pain.  She denies any history of seizures.  Review of [place]  controlled substance database shows no inappropriate refills.  She was placed in a brace for comfort and support and encouraged to use RICE protocol for additional symptom relief.  She was given the contact information for a hand specialist in case her symptoms are improving quickly and was encouraged to call them as needed.  Strict return precautions given.  All questions were answered to patient satisfaction.  Final Clinical Impressions(s) / UC Diagnoses   Final diagnoses:  Sprain of left wrist, initial encounter  Fall, initial encounter     Discharge Instructions      Your x-ray was normal with no evidence of a broken bone which is great news.  I suspect you have injured some of the soft tissue in your wrist (sprain).  Use  the brace for comfort and support.  After a week, you can start to increase your activity as you can tolerate it.  If your symptoms are not improving follow-up with  orthopedics; call to schedule an appointment.  Apply Voltaren  gel for pain relief.  I have also given you a few doses of tramadol .  This can make you sleepy so do not drive or drink alcohol with taking it.  This medication is addictive so try to limit use is much as possible.  Keep your wrist elevated and apply ice.  If anything worsens you have increasing pain, difficulty moving it, numbness or tingling in the hand, discoloration of the hand you need to be seen immediately.     ED Prescriptions     Medication Sig Dispense Auth. Provider   diclofenac  Sodium (VOLTAREN  ARTHRITIS PAIN) 1 % GEL Apply 2 g topically 4 (four) times daily. 100 g Zionah Criswell K, PA-C   traMADol  (ULTRAM ) 50 MG tablet Take 1 tablet (50 mg total) by mouth every 12 (twelve) hours as needed for up to 3 days. 6 tablet Menashe Kafer K, PA-C      I have reviewed the PDMP during this encounter.   Sherrell Rocky POUR, PA-C 05/20/24 2059

## 2024-05-20 NOTE — ED Triage Notes (Signed)
 Reports lunging after a puppy causing her to fall with LUE outstretched approx 1 hr ago. C/O pain to left wrist. LUE CMS intact. Has been applying ice.

## 2024-11-20 ENCOUNTER — Ambulatory Visit
Admission: RE | Admit: 2024-11-20 | Discharge: 2024-11-20 | Disposition: A | Discharge status: Home / Self Care | Source: Ambulatory Visit | Attending: Family Medicine | Admitting: Family Medicine

## 2024-11-20 ENCOUNTER — Other Ambulatory Visit: Payer: Self-pay | Admitting: Family Medicine

## 2024-11-20 DIAGNOSIS — N6310 Unspecified lump in the right breast, unspecified quadrant: Secondary | ICD-10-CM

## 2024-11-20 DIAGNOSIS — N6312 Unspecified lump in the right breast, upper inner quadrant: Secondary | ICD-10-CM

## 2024-11-20 DIAGNOSIS — N6320 Unspecified lump in the left breast, unspecified quadrant: Secondary | ICD-10-CM

## 2024-11-20 DIAGNOSIS — N6322 Unspecified lump in the left breast, upper inner quadrant: Secondary | ICD-10-CM

## 2025-05-21 ENCOUNTER — Other Ambulatory Visit

## 2025-05-21 ENCOUNTER — Encounter
